# Patient Record
Sex: Male | Born: 1985 | Race: White | Hispanic: No | Marital: Single | Smoking: Current every day smoker
Health system: Southern US, Community
[De-identification: ages and names within clinical notes are randomized; demographics above are authoritative.]

## PROBLEM LIST (undated history)

## (undated) DIAGNOSIS — F419 Anxiety disorder, unspecified: Secondary | ICD-10-CM

## (undated) DIAGNOSIS — F32A Depression, unspecified: Secondary | ICD-10-CM

## (undated) DIAGNOSIS — F329 Major depressive disorder, single episode, unspecified: Secondary | ICD-10-CM

---
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
## 2004-12-19 ENCOUNTER — Ambulatory Visit (HOSPITAL_COMMUNITY): Payer: Self-pay | Admitting: Psychiatry

## 2005-01-16 ENCOUNTER — Ambulatory Visit (HOSPITAL_COMMUNITY): Payer: Self-pay | Admitting: Psychiatry

## 2005-03-01 ENCOUNTER — Ambulatory Visit (HOSPITAL_COMMUNITY): Payer: Self-pay | Admitting: Psychiatry

## 2005-04-24 ENCOUNTER — Ambulatory Visit (HOSPITAL_COMMUNITY): Payer: Self-pay | Admitting: Psychiatry

## 2005-07-03 ENCOUNTER — Ambulatory Visit (HOSPITAL_COMMUNITY): Payer: Self-pay | Admitting: Psychiatry

## 2005-07-11 ENCOUNTER — Emergency Department (HOSPITAL_COMMUNITY): Admission: EM | Admit: 2005-07-11 | Discharge: 2005-07-11 | Payer: Self-pay | Admitting: Emergency Medicine

## 2005-11-22 ENCOUNTER — Ambulatory Visit (HOSPITAL_COMMUNITY): Payer: Self-pay | Admitting: Psychiatry

## 2006-01-22 ENCOUNTER — Ambulatory Visit (HOSPITAL_COMMUNITY): Payer: Self-pay | Admitting: Psychiatry

## 2006-04-09 ENCOUNTER — Ambulatory Visit (HOSPITAL_COMMUNITY): Payer: Self-pay | Admitting: Psychiatry

## 2006-06-06 ENCOUNTER — Ambulatory Visit (HOSPITAL_COMMUNITY): Payer: Self-pay | Admitting: Psychiatry

## 2006-09-12 ENCOUNTER — Ambulatory Visit (HOSPITAL_COMMUNITY): Payer: Self-pay | Admitting: Psychiatry

## 2006-11-14 ENCOUNTER — Ambulatory Visit (HOSPITAL_COMMUNITY): Payer: Self-pay | Admitting: Psychiatry

## 2007-01-14 ENCOUNTER — Ambulatory Visit (HOSPITAL_COMMUNITY): Payer: Self-pay | Admitting: Psychiatry

## 2007-04-01 ENCOUNTER — Ambulatory Visit (HOSPITAL_COMMUNITY): Payer: Self-pay | Admitting: Psychiatry

## 2007-05-07 ENCOUNTER — Emergency Department (HOSPITAL_COMMUNITY): Admission: EM | Admit: 2007-05-07 | Discharge: 2007-05-07 | Payer: Self-pay | Admitting: Emergency Medicine

## 2007-11-13 ENCOUNTER — Ambulatory Visit (HOSPITAL_COMMUNITY): Payer: Self-pay | Admitting: Psychiatry

## 2008-03-30 ENCOUNTER — Ambulatory Visit (HOSPITAL_COMMUNITY): Payer: Self-pay | Admitting: Psychiatry

## 2008-06-29 ENCOUNTER — Emergency Department (HOSPITAL_COMMUNITY): Admission: EM | Admit: 2008-06-29 | Discharge: 2008-06-29 | Payer: Self-pay | Admitting: Emergency Medicine

## 2008-10-19 ENCOUNTER — Ambulatory Visit (HOSPITAL_COMMUNITY): Payer: Self-pay | Admitting: Psychiatry

## 2009-06-30 ENCOUNTER — Ambulatory Visit (HOSPITAL_COMMUNITY): Payer: Self-pay | Admitting: Psychiatry

## 2009-07-10 ENCOUNTER — Emergency Department (HOSPITAL_COMMUNITY): Admission: EM | Admit: 2009-07-10 | Discharge: 2009-07-10 | Payer: Self-pay | Admitting: Emergency Medicine

## 2009-12-01 ENCOUNTER — Emergency Department (HOSPITAL_COMMUNITY): Admission: EM | Admit: 2009-12-01 | Discharge: 2009-12-01 | Payer: Self-pay | Admitting: Family Medicine

## 2010-02-05 ENCOUNTER — Emergency Department (HOSPITAL_COMMUNITY)
Admission: EM | Admit: 2010-02-05 | Discharge: 2010-02-05 | Payer: Self-pay | Source: Home / Self Care | Admitting: Emergency Medicine

## 2010-03-27 LAB — GC/CHLAMYDIA PROBE AMP, GENITAL: Chlamydia, DNA Probe: NEGATIVE

## 2010-04-18 ENCOUNTER — Inpatient Hospital Stay (INDEPENDENT_AMBULATORY_CARE_PROVIDER_SITE_OTHER)
Admission: RE | Admit: 2010-04-18 | Discharge: 2010-04-18 | Disposition: A | Discharge status: Home / Self Care | Payer: PRIVATE HEALTH INSURANCE | Source: Ambulatory Visit | Attending: Emergency Medicine | Admitting: Emergency Medicine

## 2010-04-18 ENCOUNTER — Emergency Department (HOSPITAL_COMMUNITY)
Admission: EM | Admit: 2010-04-18 | Discharge: 2010-04-18 | Disposition: A | Discharge status: Home / Self Care | Payer: PRIVATE HEALTH INSURANCE | Attending: Emergency Medicine | Admitting: Emergency Medicine

## 2010-04-18 DIAGNOSIS — R197 Diarrhea, unspecified: Secondary | ICD-10-CM | POA: Insufficient documentation

## 2010-04-18 DIAGNOSIS — R109 Unspecified abdominal pain: Secondary | ICD-10-CM | POA: Insufficient documentation

## 2010-04-18 DIAGNOSIS — Z79899 Other long term (current) drug therapy: Secondary | ICD-10-CM | POA: Insufficient documentation

## 2010-04-18 DIAGNOSIS — R110 Nausea: Secondary | ICD-10-CM | POA: Insufficient documentation

## 2010-04-18 DIAGNOSIS — M546 Pain in thoracic spine: Secondary | ICD-10-CM | POA: Insufficient documentation

## 2010-04-18 DIAGNOSIS — R1011 Right upper quadrant pain: Secondary | ICD-10-CM

## 2010-04-18 DIAGNOSIS — F319 Bipolar disorder, unspecified: Secondary | ICD-10-CM | POA: Insufficient documentation

## 2010-04-18 DIAGNOSIS — R509 Fever, unspecified: Secondary | ICD-10-CM | POA: Insufficient documentation

## 2010-04-18 LAB — WOUND CULTURE

## 2010-04-22 ENCOUNTER — Emergency Department (HOSPITAL_COMMUNITY)
Admission: EM | Admit: 2010-04-22 | Discharge: 2010-04-22 | Discharge status: Against Medical Advice | Payer: PRIVATE HEALTH INSURANCE | Attending: Emergency Medicine | Admitting: Emergency Medicine

## 2010-04-22 DIAGNOSIS — R109 Unspecified abdominal pain: Secondary | ICD-10-CM | POA: Insufficient documentation

## 2010-09-19 ENCOUNTER — Emergency Department (HOSPITAL_COMMUNITY)
Admission: EM | Admit: 2010-09-19 | Discharge: 2010-09-19 | Disposition: A | Discharge status: Home / Self Care | Payer: Self-pay | Attending: Emergency Medicine | Admitting: Emergency Medicine

## 2010-09-19 DIAGNOSIS — W268XXA Contact with other sharp object(s), not elsewhere classified, initial encounter: Secondary | ICD-10-CM | POA: Insufficient documentation

## 2010-09-19 DIAGNOSIS — S51809A Unspecified open wound of unspecified forearm, initial encounter: Secondary | ICD-10-CM | POA: Insufficient documentation

## 2010-09-19 DIAGNOSIS — F319 Bipolar disorder, unspecified: Secondary | ICD-10-CM | POA: Insufficient documentation

## 2010-09-19 DIAGNOSIS — Y92009 Unspecified place in unspecified non-institutional (private) residence as the place of occurrence of the external cause: Secondary | ICD-10-CM | POA: Insufficient documentation

## 2010-09-23 ENCOUNTER — Emergency Department (HOSPITAL_COMMUNITY)
Admission: EM | Admit: 2010-09-23 | Discharge: 2010-09-24 | Disposition: A | Discharge status: Other Acute Inpatient Hospital | Payer: Self-pay | Attending: Emergency Medicine | Admitting: Emergency Medicine

## 2010-09-23 DIAGNOSIS — F3130 Bipolar disorder, current episode depressed, mild or moderate severity, unspecified: Secondary | ICD-10-CM | POA: Insufficient documentation

## 2010-09-23 DIAGNOSIS — F489 Nonpsychotic mental disorder, unspecified: Secondary | ICD-10-CM | POA: Insufficient documentation

## 2010-09-24 ENCOUNTER — Inpatient Hospital Stay (HOSPITAL_COMMUNITY)
Admission: EM | Admit: 2010-09-24 | Discharge: 2010-09-28 | DRG: 885 | Disposition: A | Discharge status: Home / Self Care | Payer: PRIVATE HEALTH INSURANCE | Source: Other Acute Inpatient Hospital | Attending: Psychiatry | Admitting: Psychiatry

## 2010-09-24 DIAGNOSIS — F411 Generalized anxiety disorder: Secondary | ICD-10-CM

## 2010-09-24 DIAGNOSIS — F410 Panic disorder [episodic paroxysmal anxiety] without agoraphobia: Secondary | ICD-10-CM

## 2010-09-24 DIAGNOSIS — F339 Major depressive disorder, recurrent, unspecified: Secondary | ICD-10-CM

## 2010-09-24 DIAGNOSIS — F1410 Cocaine abuse, uncomplicated: Secondary | ICD-10-CM

## 2010-09-24 DIAGNOSIS — X838XXA Intentional self-harm by other specified means, initial encounter: Secondary | ICD-10-CM

## 2010-09-24 DIAGNOSIS — F17200 Nicotine dependence, unspecified, uncomplicated: Secondary | ICD-10-CM

## 2010-09-24 DIAGNOSIS — S51809A Unspecified open wound of unspecified forearm, initial encounter: Secondary | ICD-10-CM

## 2010-09-24 DIAGNOSIS — Z560 Unemployment, unspecified: Secondary | ICD-10-CM

## 2010-09-24 DIAGNOSIS — F1110 Opioid abuse, uncomplicated: Secondary | ICD-10-CM

## 2010-09-24 DIAGNOSIS — F1010 Alcohol abuse, uncomplicated: Secondary | ICD-10-CM

## 2010-09-24 DIAGNOSIS — F1920 Other psychoactive substance dependence, uncomplicated: Secondary | ICD-10-CM

## 2010-09-24 DIAGNOSIS — F332 Major depressive disorder, recurrent severe without psychotic features: Principal | ICD-10-CM

## 2010-09-24 LAB — CBC
MCH: 31.8 pg (ref 26.0–34.0)
MCHC: 34.9 g/dL (ref 30.0–36.0)
RBC: 5.32 MIL/uL (ref 4.22–5.81)
WBC: 8.2 10*3/uL (ref 4.0–10.5)

## 2010-09-24 LAB — RAPID URINE DRUG SCREEN, HOSP PERFORMED
Benzodiazepines: POSITIVE — AB
Cocaine: NOT DETECTED
Tetrahydrocannabinol: POSITIVE — AB

## 2010-09-24 LAB — COMPREHENSIVE METABOLIC PANEL
AST: 38 U/L — ABNORMAL HIGH (ref 0–37)
Albumin: 4.3 g/dL (ref 3.5–5.2)
CO2: 30 mEq/L (ref 19–32)
Creatinine, Ser: 0.91 mg/dL (ref 0.50–1.35)
GFR calc non Af Amer: >60 mL/min (ref >60)
Total Bilirubin: 0.3 mg/dL (ref 0.3–1.2)

## 2010-09-24 LAB — DIFFERENTIAL
Basophils Relative: 0 % (ref 0–1)
Eosinophils Relative: 2 % (ref 0–5)
Monocytes Absolute: 0.8 10*3/uL (ref 0.1–1.0)
Monocytes Relative: 10 % (ref 3–12)

## 2010-09-24 LAB — ETHANOL: Alcohol, Ethyl (B): <11 mg/dL (ref 0–11)

## 2010-09-25 LAB — RPR: RPR Ser Ql: NONREACTIVE

## 2010-09-26 LAB — GC/CHLAMYDIA PROBE AMP, URINE: Chlamydia, Swab/Urine, PCR: NEGATIVE

## 2010-09-28 NOTE — Assessment & Plan Note (Signed)
  NAME:  Lee Hernandez, Lee Hernandez NO.:  [id number]  MEDICAL RECORD NO.:  [id number]  LOCATION:  0300                          FACILITY:  [facility]  PHYSICIAN:  Orson Aloe, MD       DATE OF BIRTH:  10/07/1985  DATE OF ADMISSION:  09/24/2010 DATE OF DISCHARGE:                      PSYCHIATRIC ADMISSION ASSESSMENT   HISTORY OF PRESENT ILLNESS:  The patient presented because his family was worried about him.  He states he is here for "his mental health." He has been using Roxicodone, drinking alcohol at times, and reported drinking "a lot," drinking when he wakes up from sleep.  He has lost about 32 pounds, but states that it was weight that he wanted to take off.  He denies any current suicidal thoughts.  Had a recent self- inflicted injury, cutting himself with a buck knife sustaining significant lacerations to both arms that required staples.  PAST PSYCHIATRIC HISTORY:  First admission to [facility]. Sees Dr. Tomasa Rand and was placed on Xanax for anxiety.  Has been on Depakote in the past, but off medications for 8 months.  SOCIAL HISTORY:  The patient is single.  He has a 2-year child who lives with the mother of that child.  He lives alone in [place].  He is currently unemployed.  Has a court date pending on Tuesday for assault.  FAMILY HISTORY:  None.  ALCOHOL AND DRUG HISTORY:  As above, drinking and using Roxicodone.  PRIMARY CARE PROVIDER:  None.  MEDICAL PROBLEMS:  History of self-inflicted injuries with staples to both forearms.  MEDICATIONS:  He has been prescribed Ambien and Xanax.  DRUG ALLERGIES:  No known allergies.  PHYSICAL EXAM:  The patient is a fully alert and cooperative.  His lacerations are intact.  No redness.  No swelling.  CBC is within normal limits.  Drug screen is positive for benzodiazepines.  MENTAL STATUS EXAM:  The patient is fully alert and cooperative, fair eye contact.  Seen with Dr. Allena Katz.  The patient is  dressed in scrubs. His speech is clear, normal pace and tone.  He seems sad, embarrassed about his injuries.  Thought processes are coherent, goal directed. Denies any psychotic symptoms and does not appear to be responding.  DIAGNOSES:  Axis I:  Major depressive disorder, polysubstance abuse. Axis II:  Deferred. Axis III:  Self-inflicted lacerations. Axis IV:  Problems with unemployment, possible other psychosocial problems in legal system. Axis V:  Current is 35-40.  PLAN:  Our plan is to place the patient on both the clonidine and Librium protocol, place the patient on Celexa for his depressive symptoms, monitor his wounds, identify a support group, and assess his motivation for rehab.     Landry Corporal, N.P.   ______________________________ Orson Aloe, MD    JO/MEDQ  D:  09/24/2010  T:  09/24/2010  Job:  [id number]  Electronically Signed by Limmie PatriciaP. on 09/25/2010 12:26:23 PM Electronically Signed by Orson Aloe  on 09/28/2010 01:12:55 PM
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
## 2011-10-19 ENCOUNTER — Emergency Department (HOSPITAL_COMMUNITY): Payer: PRIVATE HEALTH INSURANCE

## 2011-10-19 ENCOUNTER — Emergency Department (HOSPITAL_COMMUNITY)
Admission: EM | Admit: 2011-10-19 | Discharge: 2011-10-19 | Disposition: A | Discharge status: Home / Self Care | Payer: PRIVATE HEALTH INSURANCE | Attending: Emergency Medicine | Admitting: Emergency Medicine

## 2011-10-19 ENCOUNTER — Encounter (HOSPITAL_COMMUNITY): Payer: Self-pay | Admitting: *Deleted

## 2011-10-19 DIAGNOSIS — S61209A Unspecified open wound of unspecified finger without damage to nail, initial encounter: Secondary | ICD-10-CM | POA: Insufficient documentation

## 2011-10-19 DIAGNOSIS — S61219A Laceration without foreign body of unspecified finger without damage to nail, initial encounter: Secondary | ICD-10-CM

## 2011-10-19 DIAGNOSIS — W230XXA Caught, crushed, jammed, or pinched between moving objects, initial encounter: Secondary | ICD-10-CM | POA: Insufficient documentation

## 2011-10-19 DIAGNOSIS — S6710XA Crushing injury of unspecified finger(s), initial encounter: Secondary | ICD-10-CM

## 2011-10-19 MED ORDER — TRAMADOL HCL 50 MG PO TABS
50.0000 mg | ORAL_TABLET | Freq: Once | ORAL | Status: AC
  Administered 2011-10-19: 50 mg via ORAL
  Filled 2011-10-19: qty 1

## 2011-10-19 MED ORDER — IBUPROFEN 800 MG PO TABS
800.0000 mg | ORAL_TABLET | Freq: Once | ORAL | Status: AC
  Administered 2011-10-19: 800 mg via ORAL
  Filled 2011-10-19: qty 1

## 2011-10-19 MED ORDER — HYDROCODONE-ACETAMINOPHEN 5-325 MG PO TABS
1.0000 | ORAL_TABLET | Freq: Once | ORAL | Status: DC
  Filled 2011-10-19: qty 1

## 2011-10-19 MED ORDER — TRAMADOL HCL 50 MG PO TABS: 50.0000 mg | ORAL_TABLET | Freq: Once | ORAL | Status: DC

## 2011-10-19 NOTE — ED Notes (Addendum)
Pt slammed finger into the door.  Pt describes pain as a shooting pain up to his arm. Half of middle finger feels numb but pt able to move fingers.  Bleeding currently controlled.

## 2011-10-19 NOTE — ED Notes (Signed)
Pt presents after slamming left hand middle finger in car door; presents with laceration to finger pad; bruising to nail bed

## 2011-10-19 NOTE — ED Notes (Signed)
MD at bedside. 

## 2011-10-19 NOTE — ED Notes (Signed)
Konrad Dolores, MD at bedside.

## 2011-10-19 NOTE — ED Provider Notes (Signed)
History     CSN: [id number]  Arrival date & time 10/19/11  2218   First MD Initiated Contact with Patient 10/19/11 2244      Chief Complaint  Patient presents with  . Finger Injury    (Consider location/radiation/quality/duration/timing/severity/associated sxs/prior treatment) HPI CC: crush in jury to finger  Crush injury to finger: 1 hour ago pt closed L ring finger in door. Immediate pain and bleeding. No loss of sensation or consciousness. No alleviating or aggrevating factors. Denies CP, palpiations. ROM normal  History reviewed. No pertinent past medical history.  History reviewed. No pertinent past surgical history.  No family history on file.  History  Substance Use Topics  . Smoking status: Current Every Day Smoker -- 0.5 packs/day    Types: Cigarettes  . Smokeless tobacco: Not on file  . Alcohol Use: Yes     occ      Review of Systems Per hpi  Allergies  Tylenol  Home Medications   Current Outpatient Rx  Name Route Sig Dispense Refill  . TRAMADOL HCL 50 MG PO TABS Oral Take 1 tablet (50 mg total) by mouth once. 30 tablet 0    BP 171/115  Pulse 77  Temp 98.3 F (36.8 C) (Oral)  Resp 16  SpO2 100%  Physical Exam Gen: NAD Musc: normal ROM in fingers, <2 sec cap refill Skin: Small 1cm laceration extending into dermis of L 4th finger. Nail w/ pooling of blood. Neuro: Sensation intact in all digits,    ED Course  Procedures (including critical care time)  Labs Reviewed - No data to display Dg Finger Middle Left  10/19/2011  *RADIOLOGY REPORT*  Clinical Data: 26 year old male blunt trauma to the left middle finger.  Laceration and pain.  LEFT MIDDLE FINGER 2+V  Comparison: None.  Findings: Dressing material about the left third finger.  Distal phalanx appears intact.  Joint spaces preserved.  No acute fracture identified.  IMPRESSION: No acute fracture or dislocation identified about the left third finger.   Original Report Authenticated By:  Ulla Potash III, M.D.      1. Crush injury to finger   2. Laceration of finger       MDM  26yo male w/ crush injury and laceration to 4th L digit. No fracture. Nerves and vessels intact. Injury is self limiting and should resolve w/o invasive intervention. Pt given option fo rrepair vs concervative mgt. - Finger cleaned w/ chlorhexidine and bandaged w/ sterile dressing and bacitracin ointment - Careful wound care instructions given - Ibuprofen 800mg  - tramadol PRN          Ozella Rocks, MD 10/20/11 0001

## 2011-10-20 NOTE — ED Provider Notes (Signed)
I saw and evaluated the patient, reviewed the resident's note and I agree with the findings and plan.   .Face to face Exam:  General:  Awake HEENT:  Atraumatic Resp:  Normal effort Abd:  Nondistended Neuro:No focal weakness Lymph: No adenopathy   Nelia Shi, MD 10/20/11 [phone]

## 2012-10-21 ENCOUNTER — Emergency Department (HOSPITAL_COMMUNITY)
Admission: EM | Admit: 2012-10-21 | Discharge: 2012-10-21 | Disposition: A | Discharge status: Other Acute Inpatient Hospital | Payer: PRIVATE HEALTH INSURANCE | Attending: Emergency Medicine | Admitting: Emergency Medicine

## 2012-10-21 ENCOUNTER — Encounter (HOSPITAL_COMMUNITY): Payer: Self-pay | Admitting: Emergency Medicine

## 2012-10-21 ENCOUNTER — Inpatient Hospital Stay (HOSPITAL_COMMUNITY)
Admission: AD | Admit: 2012-10-21 | Discharge: 2012-10-25 | DRG: 885 | Disposition: A | Discharge status: Home / Self Care | Payer: Federal, State, Local not specified - Other | Source: Intra-hospital | Attending: Psychiatry | Admitting: Psychiatry

## 2012-10-21 DIAGNOSIS — T424X4A Poisoning by benzodiazepines, undetermined, initial encounter: Secondary | ICD-10-CM | POA: Insufficient documentation

## 2012-10-21 DIAGNOSIS — R450 Nervousness: Secondary | ICD-10-CM | POA: Insufficient documentation

## 2012-10-21 DIAGNOSIS — F1120 Opioid dependence, uncomplicated: Secondary | ICD-10-CM | POA: Diagnosis present

## 2012-10-21 DIAGNOSIS — T43502A Poisoning by unspecified antipsychotics and neuroleptics, intentional self-harm, initial encounter: Secondary | ICD-10-CM | POA: Insufficient documentation

## 2012-10-21 DIAGNOSIS — Z79899 Other long term (current) drug therapy: Secondary | ICD-10-CM

## 2012-10-21 DIAGNOSIS — F17200 Nicotine dependence, unspecified, uncomplicated: Secondary | ICD-10-CM | POA: Insufficient documentation

## 2012-10-21 DIAGNOSIS — F3130 Bipolar disorder, current episode depressed, mild or moderate severity, unspecified: Secondary | ICD-10-CM | POA: Insufficient documentation

## 2012-10-21 DIAGNOSIS — F1010 Alcohol abuse, uncomplicated: Secondary | ICD-10-CM | POA: Insufficient documentation

## 2012-10-21 DIAGNOSIS — R45851 Suicidal ideations: Secondary | ICD-10-CM

## 2012-10-21 DIAGNOSIS — F411 Generalized anxiety disorder: Secondary | ICD-10-CM | POA: Diagnosis present

## 2012-10-21 DIAGNOSIS — F314 Bipolar disorder, current episode depressed, severe, without psychotic features: Principal | ICD-10-CM | POA: Diagnosis present

## 2012-10-21 HISTORY — DX: Anxiety disorder, unspecified: F41.9

## 2012-10-21 HISTORY — DX: Depression, unspecified: F32.A

## 2012-10-21 HISTORY — DX: Major depressive disorder, single episode, unspecified: F32.9

## 2012-10-21 LAB — CBC
HCT: 46.8 % (ref 39.0–52.0)
Hemoglobin: 16.5 g/dL (ref 13.0–17.0)
MCV: 84 fL (ref 78.0–100.0)
RBC: 5.57 MIL/uL (ref 4.22–5.81)
RDW: 13.4 % (ref 11.5–15.5)
WBC: 8.7 10*3/uL (ref 4.0–10.5)

## 2012-10-21 LAB — COMPREHENSIVE METABOLIC PANEL
ALT: 69 U/L — ABNORMAL HIGH (ref 0–53)
Albumin: 4.7 g/dL (ref 3.5–5.2)
Alkaline Phosphatase: 67 U/L (ref 39–117)
BUN: 9 mg/dL (ref 6–23)
CO2: 25 mEq/L (ref 19–32)
Chloride: 99 mEq/L (ref 96–112)
Creatinine, Ser: 0.8 mg/dL (ref 0.50–1.35)
GFR calc Af Amer: >90 mL/min (ref >90)
Glucose, Bld: 92 mg/dL (ref 70–99)
Potassium: 4.2 mEq/L (ref 3.5–5.1)
Sodium: 136 mEq/L (ref 135–145)
Total Bilirubin: 0.7 mg/dL (ref 0.3–1.2)
Total Protein: 7.9 g/dL (ref 6.0–8.3)

## 2012-10-21 LAB — RAPID URINE DRUG SCREEN, HOSP PERFORMED
Amphetamines: NOT DETECTED
Barbiturates: NOT DETECTED
Cocaine: NOT DETECTED
Opiates: NOT DETECTED
Tetrahydrocannabinol: NOT DETECTED

## 2012-10-21 LAB — ETHANOL: Alcohol, Ethyl (B): <11 mg/dL (ref 0–11)

## 2012-10-21 LAB — SALICYLATE LEVEL: Salicylate Lvl: <2 mg/dL — ABNORMAL LOW (ref 2.8–20.0)

## 2012-10-21 MED ORDER — VITAMIN B-1 100 MG PO TABS
100.0000 mg | ORAL_TABLET | Freq: Every day | ORAL | Status: DC
  Administered 2012-10-22 – 2012-10-25 (×4): 100 mg via ORAL
  Filled 2012-10-21 (×5): qty 1

## 2012-10-21 MED ORDER — CHLORDIAZEPOXIDE HCL 25 MG PO CAPS: 25.0000 mg | ORAL_CAPSULE | Freq: Four times a day (QID) | ORAL | Status: DC

## 2012-10-21 MED ORDER — ONDANSETRON 4 MG PO TBDP: 4.0000 mg | ORAL_TABLET | Freq: Four times a day (QID) | ORAL | Status: DC | PRN

## 2012-10-21 MED ORDER — CHLORDIAZEPOXIDE HCL 25 MG PO CAPS: 50.0000 mg | ORAL_CAPSULE | Freq: Once | ORAL | Status: DC

## 2012-10-21 MED ORDER — ADULT MULTIVITAMIN W/MINERALS CH: 1.0000 | ORAL_TABLET | Freq: Every day | ORAL | Status: DC

## 2012-10-21 MED ORDER — HYDROXYZINE HCL 50 MG PO TABS
50.0000 mg | ORAL_TABLET | Freq: Every evening | ORAL | Status: DC | PRN
  Administered 2012-10-21 – 2012-10-22 (×2): 50 mg via ORAL
  Filled 2012-10-21 (×2): qty 1

## 2012-10-21 MED ORDER — CHLORDIAZEPOXIDE HCL 25 MG PO CAPS
25.0000 mg | ORAL_CAPSULE | Freq: Four times a day (QID) | ORAL | Status: AC | PRN
  Administered 2012-10-22: 25 mg via ORAL
  Filled 2012-10-21: qty 1

## 2012-10-21 MED ORDER — LOPERAMIDE HCL 2 MG PO CAPS: 2.0000 mg | ORAL_CAPSULE | ORAL | Status: DC | PRN

## 2012-10-21 MED ORDER — HYDROXYZINE HCL 25 MG PO TABS: 25.0000 mg | ORAL_TABLET | Freq: Four times a day (QID) | ORAL | Status: DC | PRN

## 2012-10-21 MED ORDER — THIAMINE HCL 100 MG/ML IJ SOLN: 100.0000 mg | Freq: Once | INTRAMUSCULAR | Status: DC

## 2012-10-21 MED ORDER — NICOTINE 21 MG/24HR TD PT24
21.0000 mg | MEDICATED_PATCH | Freq: Every day | TRANSDERMAL | Status: DC
  Administered 2012-10-22: 21 mg via TRANSDERMAL
  Filled 2012-10-21 (×5): qty 1

## 2012-10-21 MED ORDER — ACETAMINOPHEN 325 MG PO TABS: 650.0000 mg | ORAL_TABLET | Freq: Four times a day (QID) | ORAL | Status: DC | PRN

## 2012-10-21 MED ORDER — ALUM & MAG HYDROXIDE-SIMETH 200-200-20 MG/5ML PO SUSP: 30.0000 mL | ORAL | Status: DC | PRN

## 2012-10-21 MED ORDER — HYDROXYZINE HCL 25 MG PO TABS
25.0000 mg | ORAL_TABLET | Freq: Four times a day (QID) | ORAL | Status: AC | PRN
  Administered 2012-10-22 – 2012-10-24 (×5): 25 mg via ORAL
  Filled 2012-10-21 (×5): qty 1

## 2012-10-21 MED ORDER — CHLORDIAZEPOXIDE HCL 25 MG PO CAPS: 25.0000 mg | ORAL_CAPSULE | Freq: Three times a day (TID) | ORAL | Status: DC

## 2012-10-21 MED ORDER — LORAZEPAM 1 MG PO TABS
1.0000 mg | ORAL_TABLET | Freq: Once | ORAL | Status: AC
  Administered 2012-10-21: 1 mg via ORAL
  Filled 2012-10-21: qty 1

## 2012-10-21 MED ORDER — ADULT MULTIVITAMIN W/MINERALS CH
1.0000 | ORAL_TABLET | Freq: Every day | ORAL | Status: DC
  Administered 2012-10-22 – 2012-10-25 (×4): 1 via ORAL
  Filled 2012-10-21 (×5): qty 1

## 2012-10-21 MED ORDER — CHLORDIAZEPOXIDE HCL 25 MG PO CAPS: 25.0000 mg | ORAL_CAPSULE | Freq: Every day | ORAL | Status: DC

## 2012-10-21 MED ORDER — VITAMIN B-1 100 MG PO TABS: 100.0000 mg | ORAL_TABLET | Freq: Every day | ORAL | Status: DC

## 2012-10-21 MED ORDER — CHLORDIAZEPOXIDE HCL 25 MG PO CAPS: 25.0000 mg | ORAL_CAPSULE | ORAL | Status: DC

## 2012-10-21 MED ORDER — LOPERAMIDE HCL 2 MG PO CAPS: 2.0000 mg | ORAL_CAPSULE | ORAL | Status: AC | PRN

## 2012-10-21 MED ORDER — NICOTINE 21 MG/24HR TD PT24
21.0000 mg | MEDICATED_PATCH | Freq: Once | TRANSDERMAL | Status: DC
  Administered 2012-10-21: 21 mg via TRANSDERMAL
  Filled 2012-10-21: qty 1

## 2012-10-21 MED ORDER — MAGNESIUM HYDROXIDE 400 MG/5ML PO SUSP: 30.0000 mL | Freq: Every day | ORAL | Status: DC | PRN

## 2012-10-21 MED ORDER — CHLORDIAZEPOXIDE HCL 25 MG PO CAPS: 25.0000 mg | ORAL_CAPSULE | Freq: Four times a day (QID) | ORAL | Status: DC | PRN

## 2012-10-21 MED ORDER — CHLORDIAZEPOXIDE HCL 25 MG PO CAPS
50.0000 mg | ORAL_CAPSULE | Freq: Once | ORAL | Status: AC
  Administered 2012-10-21: 50 mg via ORAL
  Filled 2012-10-21: qty 2

## 2012-10-21 MED ORDER — ONDANSETRON 4 MG PO TBDP: 4.0000 mg | ORAL_TABLET | Freq: Four times a day (QID) | ORAL | Status: AC | PRN

## 2012-10-21 NOTE — ED Notes (Signed)
Dr. Shela Commons, EDP at bedside to speak with patient
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
## 2012-10-21 NOTE — ED Notes (Signed)
Belongings placed in patient bags and given to mother of patient.   Patients clothes, wallet, shoes, vapor juice, etc with mother.   Patient showed $240.00 in wallet to mother who took the wallet with the other belongings.  Patient placed in paper scrubs.  AC called and they advised no sitter available until after 3pm.

## 2012-10-21 NOTE — ED Notes (Signed)
Pt ate 100% of dinner

## 2012-10-21 NOTE — ED Notes (Addendum)
Telepsych complete. Pt reports feeling like "electrical impulses" are jumping out of his skin. Pt calm and cooperative at this time. EDP notified of pt complaint. Front desk is sending pt's family members back to visit after being wanded by security

## 2012-10-21 NOTE — ED Notes (Addendum)
[employer] at bedside to serve IVC papers and transport pt to [facility]

## 2012-10-21 NOTE — ED Notes (Addendum)
Pt notified of family members requesting to speak with patient. Pt states he "wants to leave now" and isn't suicidal now, states he misses his wife and "needs to leave". Pt out to nurse's station to use telephone. Yvonna Alanis, PA made aware of pt's request to leave

## 2012-10-21 NOTE — ED Notes (Signed)
Pt states he took 17 xanax last night and drank alcohol in an attempt to commit suicide, pt states he also took 3 more xanax this morning. Pt was on depakote but states he has not taken it in a year. Pt alert and oriented. Denies any SI at this time but per mother pt states "this is his time to go" prior to being taken back to room.

## 2012-10-21 NOTE — BH Assessment (Signed)
Consulted with PA [name] PA-C who is requesting a TTS consult as patient is presenting with SI.   Glorious Peach, MS, LCASA Assessment Counselor

## 2012-10-21 NOTE — Tx Team (Signed)
Initial Interdisciplinary Treatment Plan  PATIENT STRENGTHS: (choose at least two) Capable of independent living Communication skills General fund of knowledge Supportive family/friends Work skills  PATIENT STRESSORS: Loss of job 1.5 months ago Medication change or noncompliance Substance abuse   PROBLEM LIST: Problem List/Patient Goals Date to be addressed Date deferred Reason deferred Estimated date of resolution  Suicide Attempt 10/21/2012     Substance Abuse 10/21/2012     Depression 10/21/2012                                          DISCHARGE CRITERIA:  Ability to meet basic life and health needs Adequate post-discharge living arrangements Improved stabilization in mood, thinking, and/or behavior Withdrawal symptoms are absent or subacute and managed without 24-hour nursing intervention  PRELIMINARY DISCHARGE PLAN: Attend aftercare/continuing care group Participate in family therapy Return to previous living arrangement  PATIENT/FAMIILY INVOLVEMENT: This treatment plan has been presented to and reviewed with the patient, Lee Hernandez.  The patient and family have been given the opportunity to ask questions and make suggestions.  Angeline Slim M 10/21/2012, 10:12 PM

## 2012-10-21 NOTE — ED Provider Notes (Signed)
Patient overdose on alcohol and Xanax due to depression. Reportedly took 17 Xanax tablets. Presently patient alert cooperative Glasgow Coma Score 15. Results for orders placed during the hospital encounter of 10/21/12  CBC      Result Value Range   WBC 8.7  4.0 - 10.5 K/uL   RBC 5.57  4.22 - 5.81 MIL/uL   Hemoglobin 16.5  13.0 - 17.0 g/dL   HCT 19.1  47.8 - 29.5 %   MCV 84.0  78.0 - 100.0 fL   MCH 29.6  26.0 - 34.0 pg   MCHC 35.3  30.0 - 36.0 g/dL   RDW 62.1  30.8 - 65.7 %   Platelets 254  150 - 400 K/uL  COMPREHENSIVE METABOLIC PANEL      Result Value Range   Sodium 136  135 - 145 mEq/L   Potassium 4.2  3.5 - 5.1 mEq/L   Chloride 99  96 - 112 mEq/L   CO2 25  19 - 32 mEq/L   Glucose, Bld 92  70 - 99 mg/dL   BUN 9  6 - 23 mg/dL   Creatinine, Ser 8.46  0.50 - 1.35 mg/dL   Calcium 9.5  8.4 - 96.2 mg/dL   Total Protein 7.9  6.0 - 8.3 g/dL   Albumin 4.7  3.5 - 5.2 g/dL   AST 37  0 - 37 U/L   ALT 69 (*) 0 - 53 U/L   Alkaline Phosphatase 67  39 - 117 U/L   Total Bilirubin 0.7  0.3 - 1.2 mg/dL   GFR calc non Af Amer >90  >90 mL/min   GFR calc Af Amer >90  >90 mL/min  ETHANOL      Result Value Range   Alcohol, Ethyl (B) <11  0 - 11 mg/dL  ACETAMINOPHEN LEVEL      Result Value Range   Acetaminophen (Tylenol), Serum <15.0  10 - 30 ug/mL  SALICYLATE LEVEL      Result Value Range   Salicylate Lvl <2.0 (*) 2.8 - 20.0 mg/dL  URINE RAPID DRUG SCREEN (HOSP PERFORMED)      Result Value Range   Opiates NONE DETECTED  NONE DETECTED   Cocaine NONE DETECTED  NONE DETECTED   Benzodiazepines POSITIVE (*) NONE DETECTED   Amphetamines NONE DETECTED  NONE DETECTED   Tetrahydrocannabinol NONE DETECTED  NONE DETECTED   Barbiturates NONE DETECTED  NONE DETECTED  GLUCOSE, CAPILLARY      Result Value Range   Glucose-Capillary 95  70 - 99 mg/dL   No results found. ivc paper filed by me as pt expressed desire to leave the ED Pt to bbe transferred to [facility] accepting MD Dr Graylon Good, MD 10/21/12 1925

## 2012-10-21 NOTE — BH Assessment (Signed)
Consulted with EDP Dr. Rennis Chris to inform him that pt has been accepted to [facility] by Verne Spurr PA and assigned to Dr.Jonnalagadda for inpatient treatment. Pt has been assigned to 503-1. Spoke with Dr. Rennis Chris and informed him that IVC recommended as patient is not willing not sign in voluntarily per Nurse Irving Burton Gentile's report. Dr. Rennis Chris reports that he is aware of this.   Glorious Peach, MS, LCASA Assessment Counselor

## 2012-10-21 NOTE — ED Notes (Signed)
Pt's family at front desk to visit patient. Patient's wife/mother insistent on visiting with patient. Family advised of visitation policy and pt's phone call privileges. Pt's family requested to speak to "someone in charge". Misty RN/Director out to speak with patient's family.

## 2012-10-21 NOTE — ED Notes (Signed)
Pt's wife and mother given back their belongings and palced in Consultation room to await results of Telepsych. Family is calm, cooperative and supportive.

## 2012-10-21 NOTE — BH Assessment (Signed)
Spoke with patient's nurse. Informed nurse that pt has been accepted to [facility] assigned to bed 503-1. Pt's nurse inquired about IVC process. Pt's nurse will ensure that EDP complete's First Examinations and Recommendation and Affidavit and Petition. Nurse agreed to fax legal paperwork to [facility] prior to transfer of patient to ensure accuracy. Informed Nurse that Wynetta Emery, MHT will be in at 7pm and could further assist with IVC process.  Glorious Peach, MS, LCASA Assessment Counselor

## 2012-10-21 NOTE — ED Notes (Signed)
Patient states that he lost his job about a month ago.  Patient states he has chronic depression and anxiety.  He claims starting last night he just had feelings of "hopelessness and I took 17 xanax 1 mg tablets.   I was drinking too".   Patient states he "thought that mixing with a lot of alcohol, I wouldn't wake up".   Patient mother at side.   She advised that patient had a "serious attempt 2 years ago where he cut his wrists".

## 2012-10-21 NOTE — ED Notes (Signed)
EMTALA reviewed by Charge RN, Jae Dire

## 2012-10-21 NOTE — ED Notes (Addendum)
Dr. Ethelda Chick states plan to [facility] Commit Patient at this time. Charge RN,and Minerva Areola at [facility] made aware. Pt refusing medication at this time.

## 2012-10-21 NOTE — ED Provider Notes (Signed)
CSN: [id number]     Arrival date & time 10/21/12  1245 History   First MD Initiated Contact with Patient 10/21/12 1313     Chief Complaint  Patient presents with  . Suicidal   (Consider location/radiation/quality/duration/timing/severity/associated sxs/prior Treatment) HPI Comments: Patient is a 27 year old male with a past medical history of depression and anxiety who presents after a suicide attempt. Patient reports losing his job 1 month ago which has only made his depression and anxiety worse. Patient reports having feelings of hopelessness last night and took 17mg  of Xanax and drank 3 beers. Patient reports taking 3mg  Xanax this morning. Patient reports having attempted suicide in the past by slitting his wrists and was treated inpatient. Patient denies any other drug use. Patient denies homicidal ideation.    Past Medical History  Diagnosis Date  . Anxiety   . Depression   . Bipolar 1 disorder    History reviewed. No pertinent past surgical history. No family history on file. History  Substance Use Topics  . Smoking status: Current Every Day Smoker -- 0.50 packs/day    Types: Cigarettes  . Smokeless tobacco: Not on file  . Alcohol Use: Yes     Comment: occ    Review of Systems  Psychiatric/Behavioral: Positive for suicidal ideas and dysphoric mood. The patient is nervous/anxious.   All other systems reviewed and are negative.    Allergies  Tylenol  Home Medications   Current Outpatient Rx  Name  Route  Sig  Dispense  Refill  . Multiple Vitamins-Minerals (MULTIVITAMIN PO)   Oral   Take 1 tablet by mouth daily.          BP 122/78  Pulse 67  Temp(Src) 97.9 F (36.6 C) (Oral)  Resp 14  Ht 5\' 7"  (1.702 m)  Wt 209 lb 3 oz (94.887 kg)  BMI 32.76 kg/m2  SpO2 100% Physical Exam  Nursing note and vitals reviewed. Constitutional: He is oriented to person, place, and time. He appears well-developed and well-nourished. No distress.  HENT:  Head: Normocephalic  and atraumatic.  Eyes: Conjunctivae and EOM are normal. Pupils are equal, round, and reactive to light.  Neck: Normal range of motion.  Cardiovascular: Normal rate and regular rhythm.  Exam reveals no gallop and no friction rub.   No murmur heard. Pulmonary/Chest: Effort normal and breath sounds normal. He has no wheezes. He has no rales. He exhibits no tenderness.  Abdominal: Soft. He exhibits no distension. There is no tenderness. There is no rebound.  Musculoskeletal: Normal range of motion.  Neurological: He is alert and oriented to person, place, and time. Coordination normal.  Speech is goal-oriented. Moves limbs without ataxia.   Skin: Skin is warm and dry.  Psychiatric:  Flat affect and depressed mood.    ED Course  Procedures (including critical care time) Labs Review Labs Reviewed  COMPREHENSIVE METABOLIC PANEL - Abnormal; Notable for the following:    ALT 69 (*)    All other components within normal limits  SALICYLATE LEVEL - Abnormal; Notable for the following:    Salicylate Lvl <2.0 (*)    All other components within normal limits  URINE RAPID DRUG SCREEN (HOSP PERFORMED) - Abnormal; Notable for the following:    Benzodiazepines POSITIVE (*)    All other components within normal limits  CBC  ETHANOL  ACETAMINOPHEN LEVEL  GLUCOSE, CAPILLARY   Imaging Review No results found.  EKG Interpretation   None       MDM  No diagnosis found.  3:11 PM Labs unremarkable for acute changes. Patient will be moved to Pod C for psychiatric evaluation. Vitals stable and patient afebrile.     [name], [place] 10/21/12 [phone]

## 2012-10-21 NOTE — ED Notes (Signed)
Telepsych in progress. 

## 2012-10-21 NOTE — ED Notes (Signed)
Pt's wife and mother at bedside. Belongins (2 purses, 1 necklace) placed in locker 3.

## 2012-10-21 NOTE — ED Notes (Signed)
EDPA at bedside. Pt's dinner order faxed

## 2012-10-21 NOTE — ED Notes (Addendum)
Pt arrived to treatment room with a pair of shoes and in paper scrubs, no other belongings with pt. Patient wanded/cleared by security.

## 2012-10-21 NOTE — Progress Notes (Signed)
27 year old male who presents involuntarily for SI attempt.  Patient state she took 60 xanax and drank heavily the night prior to being admitted to the hospital.  Patient states he current;y does not feel that way.  Patient calm and cooperative during admission process.  Patient states his stressor are he lost his job about 1 month ago and states things have been spiraling out of control since.  Patient states he had a prior suicide attempt where he severely cut both his arms.  Patient states his mother and wife are supportive.  Patient states he has a history of substance abuse but states he has been buying it off of the street and states he has not had it in about 2 weeks.  Patient states he was also drinking alcohol but states he normally does not drink.  Patient states he has been taking xanax to help calm him and to help with his anxiety.   Patient states he also has been buying suboxone and Roxicodone that he has been getting off the street.  Patient minimizes his substance abuse and states he only uses it to help him with his depression.  Patient currently denies SI/HI and denies AVH.  Consents obtained, fall safety plan reviewed and patient verbalized understanding.  Food and fluids offered and patient refused both.  Patient escorted to the unit by [facility] MHT.

## 2012-10-21 NOTE — ED Notes (Addendum)
Received call from Minerva Areola at [facility] stating pt has been accepted and inpatient is reccommended.  This RN spoke with pt, pt states he does not want to go to. Dr. Shela Commons made aware. Pt's wife and mother updated on pt's status

## 2012-10-21 NOTE — ED Notes (Signed)
Pt refusing medication at this time

## 2012-10-21 NOTE — Progress Notes (Signed)
B.Keyleen Cerrato, MHT met with patient face to face to have him complete consent to release information. Patient signed consent to release information and it was faxed to [facility] and original copies were sent with [employer] to [facility].

## 2012-10-21 NOTE — ED Notes (Signed)
Pt's mother took his belongings home. 

## 2012-10-22 ENCOUNTER — Encounter (HOSPITAL_COMMUNITY): Payer: Self-pay | Admitting: Behavioral Health

## 2012-10-22 DIAGNOSIS — F314 Bipolar disorder, current episode depressed, severe, without psychotic features: Secondary | ICD-10-CM | POA: Diagnosis present

## 2012-10-22 DIAGNOSIS — R45851 Suicidal ideations: Secondary | ICD-10-CM

## 2012-10-22 DIAGNOSIS — F3130 Bipolar disorder, current episode depressed, mild or moderate severity, unspecified: Secondary | ICD-10-CM

## 2012-10-22 DIAGNOSIS — F1994 Other psychoactive substance use, unspecified with psychoactive substance-induced mood disorder: Secondary | ICD-10-CM

## 2012-10-22 DIAGNOSIS — F1910 Other psychoactive substance abuse, uncomplicated: Secondary | ICD-10-CM

## 2012-10-22 DIAGNOSIS — F1120 Opioid dependence, uncomplicated: Secondary | ICD-10-CM | POA: Diagnosis present

## 2012-10-22 MED ORDER — DULOXETINE HCL 30 MG PO CPEP
30.0000 mg | ORAL_CAPSULE | Freq: Every day | ORAL | Status: DC
  Administered 2012-10-22 – 2012-10-25 (×4): 30 mg via ORAL
  Filled 2012-10-22 (×6): qty 1

## 2012-10-22 MED ORDER — QUETIAPINE FUMARATE 100 MG PO TABS
100.0000 mg | ORAL_TABLET | Freq: Every day | ORAL | Status: DC
  Administered 2012-10-22 – 2012-10-24 (×3): 100 mg via ORAL
  Filled 2012-10-22 (×5): qty 1

## 2012-10-22 NOTE — BHH Counselor (Signed)
Adult Comprehensive Assessment  Patient ID: Lee Hernandez, male   DOB: [birth year], 27 y.o.   MRN: [id number]  Information Source: Information source: Patient  Current Stressors:  Educational / Learning stressors: N/A Employment / Job issues: unemployed Family Relationships: N/A Surveyor, quantity / Lack of resources (include bankruptcy): finances are tight Housing / Lack of housing: N/A Physical health (include injuries & life threatening diseases): N/A Social relationships: N/A Substance abuse: N/A Bereavement / Loss: N/A  Living/Environment/Situation:  Living Arrangements: Spouse/significant other Living conditions (as described by patient or guardian): Pt lives in [place] with wife and child.  Pt reports this is an "exceptional" home environment.  How long has patient lived in current situation?: 6 months What is atmosphere in current home: Supportive;Loving;Comfortable  Family History:  Marital status: Married Number of Years Married: 1 What types of issues is patient dealing with in the relationship?: Pt states that they have a great marriage.   Additional relationship information: N/A Does patient have children?: Yes How many children?: 2 How is patient's relationship with their children?: Pt has two children, reports having a good relationship with both kids.    Childhood History:  By whom was/is the patient raised?: Both parents Additional childhood history information: Pt reports having a "phenominal" childhood and couldnt ask for better parents.   Description of patient's relationship with caregiver when they were a child: Pt reports having a great relationship with parents.  Patient's description of current relationship with people who raised him/her: Pt states that his parents are still very supportive.   Does patient have siblings?: Yes Number of Siblings: 1 Description of patient's current relationship with siblings: 1 brother, pt reports good relationship with him Did  patient suffer any verbal/emotional/physical/sexual abuse as a child?: No Did patient suffer from severe childhood neglect?: No Has patient ever been sexually abused/assaulted/raped as an adolescent or adult?: No Was the patient ever a victim of a crime or a disaster?: No Witnessed domestic violence?: No Has patient been effected by domestic violence as an adult?: No  Education:  Highest grade of school patient has completed: 2 Associate's degrees Currently a student?: No Learning disability?: Yes What learning problems does patient have?: ADHD  Employment/Work Situation:   Employment situation: Unemployed Patient's job has been impacted by current illness: No What is the longest time patient has a held a job?: 2 years 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  If attempted suicide, did drugs/alcohol play a role in this?: No Alcohol/Substance Abuse Treatment Hx: Denies past history If yes, describe treatment: N/A Has alcohol/substance abuse ever caused legal problems?: No  Social Support System:   Patient's Community Support System: Good Describe Community Support System: Pt reports family is very supportive Type of faith/religion: Christian How does patient's faith help to cope with current illness?: prayer, attend [facility] program  Leisure/Recreation:   Leisure and Hobbies: Music, cars, guns, spending time with family  Strengths/Needs:   What things does the  patient do well?: Pt reports that he is  good at anything he learns.  In what areas does patient struggle / problems for patient: Depression, SI  Discharge Plan:   Does patient have access to transportation?: Yes Will patient be returning to same living situation after discharge?: Yes Currently receiving community mental health services: No If no, would patient like referral for services when discharged?: Yes (What county?) [facility]) Does patient have financial barriers related to discharge medications?: No  Summary/Recommendations:     Patient is a 27 year old Caucasian Male with a diagnosis of 296.33 Major Depressive Disorder,Severe, Recurrent, Alprazolam Use Disorder, 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  Patient lives in [place] with his family.  Pt states that he has been depressed and overdosed on xanax.  Pt doesn't think he needs to be here and was already planning on going to [facility] for outpatient services.  Patient will benefit from crisis stabilization, medication evaluation, group therapy and psycho education in addition to case management for discharge planning.    Lee Hernandez, Lee Hernandez. 10/22/2012

## 2012-10-22 NOTE — Progress Notes (Signed)
The focus of this group is to educate the patient on the purpose and policies of crisis stabilization and provide a format to answer questions about their admission.  The group details unit policies and expectations of patients while admitted.  Patient did not attended 0900 nurse education orientation group this morning.  Patient was in conference with MD and Child psychotherapist.

## 2012-10-22 NOTE — BHH Suicide Risk Assessment (Signed)
BHH INPATIENT:  Family/Significant Other Suicide Prevention Education  Suicide Prevention Education:  Education Completed; Hershel Corkery - wife [phone]),  (name of family member/significant other) has been identified by the patient as the family member/significant other with whom the patient will be residing, and identified as the person(s) who will aid the patient in the event of a mental health crisis (suicidal ideations/suicide attempt).  With written consent from the patient, the family member/significant other has been provided the following suicide prevention education, prior to the and/or following the discharge of the patient.  The suicide prevention education provided includes the following:  Suicide risk factors  Suicide prevention and interventions  National Suicide Hotline telephone number  [facility] assessment telephone number  [facility] Emergency Assistance 911  [facility] and/or Residential Mobile Crisis Unit telephone number  Request made of family/significant other to:  Remove weapons (e.g., guns, rifles, knives), all items previously/currently identified as safety concern.    Remove drugs/medications (over-the-counter, prescriptions, illicit drugs), all items previously/currently identified as a safety concern.  The family member/significant other verbalizes understanding of the suicide prevention education information provided.  The family member/significant other agrees to remove the items of safety concern listed above. Wife states that pt has OCD and shows symptoms at home such as placing things and fixing it for 10 minutes, fixates on things and has a tics.  Wife states that pt has a hard time coping with things that are out of his control, such a light not turning green when he wants it to.    Carmina Miller 10/22/2012, 2:48 PM

## 2012-10-22 NOTE — Progress Notes (Signed)
Patient ID: Lee Hernandez, male   DOB: July 25, 1985, 27 y.o.   MRN: [id number] D- Patient reports he slept fair and his appetite is good.  His energy level is normal and his ability to  pay attention is improving.  He is rating his depression at 4/10 and hopelessness at 4/10.  He is not feeling anxious at this time.  His only detox symptom is "feels like a light tazer is being applied to my skin all over" and vistaril decreased this feeling.  Patient says that he frequently feels happy until something happens to effect his good mood" like an argument with my wife".  Says that he doesn't always feel depressed but his moods vacillate a lot with circumsta.  A- Supported patient and encouraged him to go to groups.  Patient did go to group each time he was asked to go.GAve him information about groups.

## 2012-10-22 NOTE — Progress Notes (Signed)
D Pt. Had visitors this pm and went to bed after they left.  Writer had to waken the pt. To assess him.     A Writer offered support and encouragement.  R  Pt. Reports his depression is down to a 2 and pt. Denies SI and HI.  NO withdrawal symptoms noted, no complaints of pain or discomfort noted

## 2012-10-22 NOTE — H&P (Signed)
Psychiatric Admission Assessment Adult  Patient Identification:  Lee Hernandez Date of Evaluation:  10/22/2012 Chief Complaint:  bipolar disorder History of Present Illness: Patient admitted involuntarily, emergently from [facility] for depression and anxiety and suicide attempt. He has stated that he has been depressed and anxious since he lost his job and made a suicidal attempt with about 17 pills of 1 mg Xanax and drank a couple of beers. He has stated that he is feeling extremely hopeless and depressed. He  denies that his intention was to kill himself stating that he just wanted the psychological pain to go away and to sleep better. He reports increased isolation and hopelessness since losing his job about a month ago. He texted his mom out of desperation last night to inform her of how he felt and could not take the emotional pain any longer. He has a history of abusing Xanax, Etoh, Roxycodone,Klonopin,and Suboxone. His last abuse of Suboxone two weeks ago. He started taking Suboxone because his friends recommended that it helps with cravings and urges to use other substances. He describes his feeling like "mild electrical shocks" are going through his body. He had an appointment with [facility] 2-3 weeks ago and was prescribed medication. He was suppose to be seen by a counselor but failed to do so. He did not get his prescriptions filled because he feared that they would not be effective but on the contrary patient states he needs to get on the right medications. UDS is positive for benzo's.  Elements:  Location:  [facility] adult. Quality:  depression . Severity:  suicidal attempt. Timing:  lost job. Duration:  one month. Context:  financial and family stresses. Associated Signs/Synptoms: Depression Symptoms:  depressed mood, anhedonia, insomnia, psychomotor retardation, fatigue, feelings of worthlessness/guilt, difficulty concentrating, hopelessness, impaired memory, suicidal  attempt, anxiety, decreased labido, decreased appetite, (Hypo) Manic Symptoms:  Distractibility, Flight of Ideas, Impulsivity, Irritable Mood, Labiality of Mood, Anxiety Symptoms:  Excessive Worry, Psychotic Symptoms:  denied PTSD Symptoms: NA  Psychiatric Specialty Exam: Physical Exam  ROS  Blood pressure 123/85, pulse 74, temperature 97.1 F (36.2 C), temperature source Oral, resp. rate 16, height 5\' 7"  (1.702 m), weight 94.348 kg (208 lb), SpO2 100.00%.Body mass index is 32.57 kg/(m^2).  General Appearance: Disheveled  Eye Solicitor::  Fair  Speech:  Pressured  Volume:  Decreased  Mood:  Anxious, Depressed, Hopeless, Irritable and Worthless  Affect:  Depressed, Flat and Labile  Thought Process:  Disorganized and Tangential  Orientation:  Full (Time, Place, and Person)  Thought Content:  Rumination  Suicidal Thoughts:  Yes.  with intent/plan  Homicidal Thoughts:  No  Memory:  Immediate;   Fair  Judgement:  Impaired  Insight:  Lacking  Psychomotor Activity:  Restlessness  Concentration:  Fair  Recall:  Fair  Akathisia:  NA  Handed:  Right  AIMS (if indicated):     Assets:  Communication Skills Desire for Improvement Financial Resources/Insurance Physical Health Resilience Social Support Transportation  Sleep:  Number of Hours: 5.5    Past Psychiatric History: Diagnosis:  Hospitalizations:  Outpatient Care:  Substance Abuse Care:  Self-Mutilation:  Suicidal Attempts:  Violent Behaviors:   Past Medical History:   Past Medical History  Diagnosis Date  . Anxiety   . Depression   . Bipolar 1 disorder    None. Allergies:   Allergies  Allergen Reactions  . Tylenol [Acetaminophen] Rash and Other (See Comments)    Upset stomach   PTA Medications: Prescriptions prior to admission  Medication Sig Dispense Refill  . Multiple Vitamins-Minerals (MULTIVITAMIN PO) Take 1 tablet by mouth daily.        Previous Psychotropic Medications:  Medication/Dose   none               Substance Abuse History in the last 12 months:  yes  Consequences of Substance Abuse: NA  Social History:  reports that he has been smoking Cigarettes.  He has been smoking about 0.50 packs per day. He does not have any smokeless tobacco history on file. He reports that he drinks alcohol. He reports that he uses illicit drugs (Opium and Benzodiazepines). Additional Social History:                      Current Place of Residence:   Place of Birth:   Family Members: Marital Status:  Married Children:  Sons:  Daughters: Relationships: Education:  [employer] Problems/Performance: Religious Beliefs/Practices: History of Abuse (Emotional/Phsycial/Sexual) Teacher, music History:  None. Legal History: Hobbies/Interests:  Family History:  History reviewed. No pertinent family history.  Results for orders placed during the hospital encounter of 10/21/12 (from the past 72 hour(s))  CBC     Status: None   Collection Time    10/21/12  1:12 PM      Result Value Range   WBC 8.7  4.0 - 10.5 K/uL   RBC 5.57  4.22 - 5.81 MIL/uL   Hemoglobin 16.5  13.0 - 17.0 g/dL   HCT 16.1  09.6 - 04.5 %   MCV 84.0  78.0 - 100.0 fL   MCH 29.6  26.0 - 34.0 pg   MCHC 35.3  30.0 - 36.0 g/dL   RDW 40.9  81.1 - 91.4 %   Platelets 254  150 - 400 K/uL  COMPREHENSIVE METABOLIC PANEL     Status: Abnormal   Collection Time    10/21/12  1:12 PM      Result Value Range   Sodium 136  135 - 145 mEq/L   Potassium 4.2  3.5 - 5.1 mEq/L   Chloride 99  96 - 112 mEq/L   CO2 25  19 - 32 mEq/L   Glucose, Bld 92  70 - 99 mg/dL   BUN 9  6 - 23 mg/dL   Creatinine, Ser 7.82  0.50 - 1.35 mg/dL   Calcium 9.5  8.4 - 95.6 mg/dL   Total Protein 7.9  6.0 - 8.3 g/dL   Albumin 4.7  3.5 - 5.2 g/dL   AST 37  0 - 37 U/L   ALT 69 (*) 0 - 53 U/L   Alkaline Phosphatase 67  39 - 117 U/L   Total Bilirubin 0.7  0.3 - 1.2 mg/dL   GFR calc non Af Amer >90  >90  mL/min   GFR calc Af Amer >90  >90 mL/min   Comment: (NOTE)     The eGFR has been calculated using the CKD EPI equation.     This calculation has not been validated in all clinical situations.     eGFR's persistently <90 mL/min signify possible Chronic Kidney     Disease.  ETHANOL     Status: None   Collection Time    10/21/12  1:12 PM      Result Value Range   Alcohol, Ethyl (B) <11  0 - 11 mg/dL   Comment:            LOWEST DETECTABLE LIMIT FOR  SERUM ALCOHOL IS 11 mg/dL     FOR MEDICAL PURPOSES ONLY  ACETAMINOPHEN LEVEL     Status: None   Collection Time    10/21/12  1:12 PM      Result Value Range   Acetaminophen (Tylenol), Serum <15.0  10 - 30 ug/mL   Comment:            THERAPEUTIC CONCENTRATIONS VARY     SIGNIFICANTLY. A RANGE OF 10-30     ug/mL MAY BE AN EFFECTIVE     CONCENTRATION FOR MANY PATIENTS.     HOWEVER, SOME ARE BEST TREATED     AT CONCENTRATIONS OUTSIDE THIS     RANGE.     ACETAMINOPHEN CONCENTRATIONS     >150 ug/mL AT 4 HOURS AFTER     INGESTION AND >50 ug/mL AT 12     HOURS AFTER INGESTION ARE     OFTEN ASSOCIATED WITH TOXIC     REACTIONS.  SALICYLATE LEVEL     Status: Abnormal   Collection Time    10/21/12  1:12 PM      Result Value Range   Salicylate Lvl <2.0 (*) 2.8 - 20.0 mg/dL  GLUCOSE, CAPILLARY     Status: None   Collection Time    10/21/12  1:36 PM      Result Value Range   Glucose-Capillary 95  70 - 99 mg/dL  URINE RAPID DRUG SCREEN (HOSP PERFORMED)     Status: Abnormal   Collection Time    10/21/12  2:48 PM      Result Value Range   Opiates NONE DETECTED  NONE DETECTED   Cocaine NONE DETECTED  NONE DETECTED   Benzodiazepines POSITIVE (*) NONE DETECTED   Amphetamines NONE DETECTED  NONE DETECTED   Tetrahydrocannabinol NONE DETECTED  NONE DETECTED   Barbiturates NONE DETECTED  NONE DETECTED   Comment:            DRUG SCREEN FOR MEDICAL PURPOSES     ONLY.  IF CONFIRMATION IS NEEDED     FOR ANY PURPOSE, NOTIFY LAB     WITHIN 5  DAYS.                LOWEST DETECTABLE LIMITS     FOR URINE DRUG SCREEN     Drug Class       Cutoff (ng/mL)     Amphetamine      1000     Barbiturate      200     Benzodiazepine   200     Tricyclics       300     Opiates          300     Cocaine          300     THC              50   Psychological Evaluations:  Assessment:   DSM5:  Schizophrenia Disorders:   Obsessive-Compulsive Disorders:   Trauma-Stressor Disorders:   Substance/Addictive Disorders:  Alcohol Intoxication with Use Disorder - Severe (F10.229) and Opioid Disorder - Mild (305.50) Depressive Disorders:  Major Depressive Disorder - Severe (296.23)  AXIS I:  Bipolar, Depressed, Substance Induced Mood Disorder and Polysubstance abuse AXIS II:  Deferred AXIS III:   Past Medical History  Diagnosis Date  . Anxiety   . Depression   . Bipolar 1 disorder    AXIS IV:  economic problems, occupational problems, other psychosocial or environmental problems and problems  related to social environment AXIS V:  41-50 serious symptoms  Treatment Plan/Recommendations:  Admit involuntarily, emergently for bipolar disorder, depression with suicidal attempt, polysubstance abuse and safety monitoring.  Treatment Plan Summary: Daily contact with patient to assess and evaluate symptoms and progress in treatment Medication management Current Medications:  Current Facility-Administered Medications  Medication Dose Route Frequency Provider Last Rate Last Dose  . acetaminophen (TYLENOL) tablet 650 mg  650 mg Oral Q6H PRN Verne Spurr, PA-C      . alum & mag hydroxide-simeth (MAALOX/MYLANTA) 200-200-20 MG/5ML suspension 30 mL  30 mL Oral Q4H PRN Verne Spurr, PA-C      . chlordiazePOXIDE (LIBRIUM) capsule 25 mg  25 mg Oral Q6H PRN Verne Spurr, PA-C      . DULoxetine (CYMBALTA) DR capsule 30 mg  30 mg Oral Daily Nehemiah Settle, MD      . hydrOXYzine (ATARAX/VISTARIL) tablet 25 mg  25 mg Oral Q6H PRN Verne Spurr, PA-C    25 mg at 10/22/12 7829  . hydrOXYzine (ATARAX/VISTARIL) tablet 50 mg  50 mg Oral QHS PRN Verne Spurr, PA-C   50 mg at 10/21/12 2311  . loperamide (IMODIUM) capsule 2-4 mg  2-4 mg Oral PRN Verne Spurr, PA-C      . magnesium hydroxide (MILK OF MAGNESIA) suspension 30 mL  30 mL Oral Daily PRN Verne Spurr, PA-C      . multivitamin with minerals tablet 1 tablet  1 tablet Oral Daily Verne Spurr, PA-C   1 tablet at 10/22/12 0820  . nicotine (NICODERM CQ - dosed in mg/24 hours) patch 21 mg  21 mg Transdermal Q0600 Nehemiah Settle, MD   21 mg at 10/22/12 5621  . ondansetron (ZOFRAN-ODT) disintegrating tablet 4 mg  4 mg Oral Q6H PRN Verne Spurr, PA-C      . QUEtiapine (SEROQUEL) tablet 100 mg  100 mg Oral QHS Nehemiah Settle, MD      . thiamine (B-1) injection 100 mg  100 mg Intramuscular Once [employer], PA-C      . thiamine (VITAMIN B-1) tablet 100 mg  100 mg Oral Daily Verne Spurr, PA-C   100 mg at 10/22/12 0820    Observation Level/Precautions:  15 minute checks  Laboratory:  Reviewed admission labs  Psychotherapy:  Group, milieu, substance abuse counseling  Medications:  cymbalta and seroquel and supportive therapy  Consultations:  none  Discharge Concerns:  safety  Estimated LOS: 5-7 days  Other:     I certify that inpatient services furnished can reasonably be expected to improve the patient's condition.   Lee Hernandez,Lee R. 10/14/201412:55 PM

## 2012-10-22 NOTE — Progress Notes (Signed)
Adult Psychoeducational Group Note  Date:  10/22/2012 Time: 11:00am  Group Topic/Focus:  Recovery Goals:   The focus of this group is to identify appropriate goals for recovery and establish a plan to achieve them.  Participation Level:  Active  Participation Quality:  Appropriate and Attentive  Affect:  Appropriate  Cognitive:  Alert and Appropriate  Insight: Appropriate  Engagement in Group:  Engaged  Modes of Intervention:  Discussion and Education  Additional Comments:   Pt attended and participated in group. Discussion today was on recovery and what is their definition of recovery and what was their breaking point. Pt stated recovery means to him to be sober of anything that can become a habit. Pt stated his breaking point was losing his job and losing his possessions, struggling financially as well as feeling helpless.   Shelly Bombard D 10/22/2012, 1:24 PM

## 2012-10-22 NOTE — Progress Notes (Signed)
Adult Psychoeducational Group Note  Date:  10/22/2012 Time:  9:27 PM  Group Topic/Focus:  Wrap-Up Group:   The focus of this group is to help patients review their daily goal of treatment and discuss progress on daily workbooks.  Participation Level:  Did Not Attend   Gerrit Heck 10/22/2012, 9:27 PM

## 2012-10-22 NOTE — [facility] Group Notes (Signed)
[facility] LCSW Group Therapy  10/22/2012  1:15 PM   Type of Therapy:  Group Therapy  Participation Level:  Active  Participation Quality:  Appropriate and Attentive  Affect:  Appropriate and Anxious  Cognitive:  Alert and Appropriate  Insight:  Developing/Improving and Engaged  Engagement in Therapy:  Developing/Improving and Engaged  Modes of Intervention:  Clarification, Confrontation, Discussion, Education, Exploration, Limit-setting, Orientation, Problem-solving, Rapport Building, Dance movement psychotherapist, Socialization and Support  Summary of Progress/Problems: The topic for group therapy was feelings about diagnosis.  Pt actively participated in group discussion on their past and current diagnosis and how they feel towards this.  Pt also identified how society and family members judge them, based on their diagnosis as well as stereotypes and stigmas.   Pt shared that knows he needs to stay on his medication to stay well, as he reports going through this cycle three times before.  Pt was able to process what led to this hospitalization and accepting his diagnosis, to stay on meds in order to stay well.  Pt actively participated and was engaged in group discussion.    Reyes Ivan, LCSWA 10/22/2012 2:46 PM

## 2012-10-22 NOTE — Progress Notes (Signed)
Recreation Therapy Notes   Date: 10.14.2014 Time: 2:45pm Location: [address] Dayroom  Group Topic: Software engineer Activities (AAA)  Behavioral Response: Engaged, Attentive  Affect: Euthymic  Clinical Observations/Feedback: Dog Team: Education officer, museum. Patient interacted appropriately with peer, dog team, LRT and MHT.   Marykay Lex Karcyn Menn, LRT/CTRS  Jearl Klinefelter 10/22/2012 4:23 PM

## 2012-10-22 NOTE — Progress Notes (Signed)
Pt. Resting quietly.Marland Kitchen  Resp even unlabored, no signs of distress or discomfort noted.

## 2012-10-22 NOTE — [facility] Suicide Risk Assessment (Signed)
Suicide Risk Assessment  Admission Assessment     Nursing information obtained from:    Demographic factors:    Current Mental Status:    Loss Factors:    Historical Factors:    Risk Reduction Factors:     CLINICAL FACTORS:   Severe Anxiety and/or Agitation Bipolar Disorder:   Mixed State Depression:   Anhedonia Comorbid alcohol abuse/dependence Hopelessness Impulsivity Insomnia Recent sense of peace/wellbeing Severe Alcohol/Substance Abuse/Dependencies Personality Disorders:   Comorbid alcohol abuse/dependence Unstable or Poor Therapeutic Relationship Previous Psychiatric Diagnoses and Treatments Medical Diagnoses and Treatments/Surgeries  COGNITIVE FEATURES THAT CONTRIBUTE TO RISK:  Closed-mindedness Loss of executive function Polarized thinking Thought constriction (tunnel vision)    SUICIDE RISK:   Moderate:  Frequent suicidal ideation with limited intensity, and duration, some specificity in terms of plans, no associated intent, good self-control, limited dysphoria/symptomatology, some risk factors present, and identifiable protective factors, including available and accessible social support.  PLAN OF CARE: Admit involuntarily for bipolar disorder with depression and suicidal ideation and behaviors and polysubstance abuse.   I certify that inpatient services furnished can reasonably be expected to improve the patient's condition.  Lee Hernandez,Lee R. 10/22/2012, 12:53 PM

## 2012-10-23 NOTE — [facility] Group Notes (Signed)
[facility] LCSW Group Therapy  Emotional Regulation 1:15 - 2: 30 PM        10/23/2012    Type of Therapy:  Group Therapy  Participation Level:  Appropriate  Participation Quality:  Appropriate  Affect:  Appropriate  Cognitive:  Attentive Appropriate  Insight:  Engaged  Engagement in Therapy:  Engaged  Modes of Intervention:  Discussion Exploration Problem-Solving Supportive  Summary of Progress/Problems:  Group topic was emotional regulations.  Patient participated in the discussion and was able to identify an emotion that needed to regulated.  Patient shared he deals with guilt from loss of job and not being able to provide for his family as he once did.  Patient stated he knows suicide is not the answer to his problems. Patient was able to identify approprite coping skills.  Wynn Banker 10/23/2012

## 2012-10-23 NOTE — ED Provider Notes (Signed)
Medical screening examination/treatment/procedure(s) were performed by non-physician practitioner and as supervising physician I was immediately available for consultation/collaboration.  Patient discussed with me and with suicide attempt with Xanax. Will need telepsychiatry.   Shelda Jakes, MD 10/23/12 1031

## 2012-10-23 NOTE — Progress Notes (Signed)
[facility] MD Progress Note  10/23/2012 2:23 PM Lee Hernandez  MRN:  [id number] Subjective:  Lee Hernandez states he is doing well today. He states he feels better today than he did yesterday and that he realizes he didn't mean to kill himself, but he does feel that he needed to get back on his medication. Diagnosis:   DSM5: Schizophrenia Disorders:  Obsessive-Compulsive Disorders:  Trauma-Stressor Disorders:  Substance/Addictive Disorders: Alcohol Intoxication with Use Disorder - Severe (F10.229) and Opioid Disorder - Mild (305.50)  Depressive Disorders: Major Depressive Disorder - Severe (296.23)  AXIS I: Bipolar, Depressed, Substance Induced Mood Disorder and Polysubstance abuse  AXIS II: Deferred  AXIS III:  Past Medical History   Diagnosis  Date   .  Anxiety    .  Depression    .  Bipolar 1 disorder     AXIS IV: economic problems, occupational problems, other psychosocial or environmental problems and problems related to social environment  AXIS V: 41-50 serious symptoms  ADL's:  Intact  Sleep: Fair  Appetite:  Good  Suicidal Ideation:  SI + no intent, no plans Homicidal Ideation:  denies AEB (as evidenced by):  Psychiatric Specialty Exam: ROS  Blood pressure 116/79, pulse 100, temperature 97.8 F (36.6 C), temperature source Oral, resp. rate 16, height 5\' 7"  (1.702 m), weight 94.348 kg (208 lb), SpO2 100.00%.Body mass index is 32.57 kg/(m^2).  General Appearance: Disheveled  Eye Solicitor::  Fair  Speech:  Pressured  Volume:  Increased  Mood:  Anxious and Depressed  Affect:  Labile  Thought Process:  Circumstantial  Orientation:  Full (Time, Place, and Person)  Thought Content:  WDL  Suicidal Thoughts:  Yes.  without intent/plan  Homicidal Thoughts:  No  Memory:  Immediate;   Fair  Judgement:  Poor  Insight:  Present  Psychomotor Activity:  Increased  Concentration:  Fair  Recall:  Fair  Akathisia:  No  Handed:  Right  AIMS (if indicated):     Assets:  Communication  Skills Housing Physical Health Social Support  Sleep:  Number of Hours: 6.75   Current Medications: Current Facility-Administered Medications  Medication Dose Route Frequency Provider Last Rate Last Dose  . acetaminophen (TYLENOL) tablet 650 mg  650 mg Oral Q6H PRN Lee Spurr, PA-C      . alum & mag hydroxide-simeth (MAALOX/MYLANTA) 200-200-20 MG/5ML suspension 30 mL  30 mL Oral Q4H PRN Lee Spurr, PA-C      . chlordiazePOXIDE (LIBRIUM) capsule 25 mg  25 mg Oral Q6H PRN Lee Spurr, PA-C   25 mg at 10/22/12 1705  . DULoxetine (CYMBALTA) DR capsule 30 mg  30 mg Oral Daily Lee Settle, MD   30 mg at 10/23/12 0757  . hydrOXYzine (ATARAX/VISTARIL) tablet 25 mg  25 mg Oral Q6H PRN Lee Spurr, PA-C   25 mg at 10/23/12 1158  . hydrOXYzine (ATARAX/VISTARIL) tablet 50 mg  50 mg Oral QHS PRN Lee Spurr, PA-C   50 mg at 10/22/12 2215  . loperamide (IMODIUM) capsule 2-4 mg  2-4 mg Oral PRN Lee Spurr, PA-C      . magnesium hydroxide (MILK OF MAGNESIA) suspension 30 mL  30 mL Oral Daily PRN Lee Spurr, PA-C      . multivitamin with minerals tablet 1 tablet  1 tablet Oral Daily Lee Spurr, PA-C   1 tablet at 10/23/12 0757  . nicotine (NICODERM CQ - dosed in mg/24 hours) patch 21 mg  21 mg Transdermal Q0600 Lee Settle, MD  21 mg at 10/22/12 0624  . ondansetron (ZOFRAN-ODT) disintegrating tablet 4 mg  4 mg Oral Q6H PRN Lee Spurr, PA-C      . QUEtiapine (SEROQUEL) tablet 100 mg  100 mg Oral QHS Lee Settle, MD   100 mg at 10/22/12 2215  . thiamine (B-1) injection 100 mg  100 mg Intramuscular Once [employer], PA-C      . thiamine (VITAMIN B-1) tablet 100 mg  100 mg Oral Daily Lee Spurr, PA-C   100 mg at 10/23/12 1610    Lab Results:  Results for orders placed during the hospital encounter of 10/21/12 (from the past 48 hour(s))  URINE RAPID DRUG SCREEN (HOSP PERFORMED)     Status: Abnormal   Collection Time    10/21/12  2:48 PM       Result Value Range   Opiates NONE DETECTED  NONE DETECTED   Cocaine NONE DETECTED  NONE DETECTED   Benzodiazepines POSITIVE (*) NONE DETECTED   Amphetamines NONE DETECTED  NONE DETECTED   Tetrahydrocannabinol NONE DETECTED  NONE DETECTED   Barbiturates NONE DETECTED  NONE DETECTED   Comment:            DRUG SCREEN FOR MEDICAL PURPOSES     ONLY.  IF CONFIRMATION IS NEEDED     FOR ANY PURPOSE, NOTIFY LAB     WITHIN 5 DAYS.                LOWEST DETECTABLE LIMITS     FOR URINE DRUG SCREEN     Drug Class       Cutoff (ng/mL)     Amphetamine      1000     Barbiturate      200     Benzodiazepine   200     Tricyclics       300     Opiates          300     Cocaine          300     THC              50    Physical Findings: AIMS: Facial and Oral Movements Muscles of Facial Expression: None, normal Lips and Perioral Area: None, normal Jaw: None, normal Tongue: None, normal,Extremity Movements Upper (arms, wrists, hands, fingers): None, normal Lower (legs, knees, ankles, toes): None, normal, Trunk Movements Neck, shoulders, hips: None, normal, Overall Severity Severity of abnormal movements (highest score from questions above): None, normal Incapacitation due to abnormal movements: None, normal Patient's awareness of abnormal movements (rate only patient's report): No Awareness, Dental Status Current problems with teeth and/or dentures?: No Does patient usually wear dentures?: No  CIWA:  CIWA-Ar Total: 1 COWS:     Treatment Plan Summary: Daily contact with patient to assess and evaluate symptoms and progress in treatment Medication management  Plan: 1. Continue crisis management and stabilization. 2. Medication management to reduce current symptoms to base line and improve patient's overall level of functioning 3. Treat health problems as indicated. 4. Develop treatment plan to decrease risk of relapse upon discharge and the need for readmission. 5. Psycho-social  education regarding relapse prevention and self care. 6. Health care follow up as needed for medical problems. 7. Continue home medications where appropriate. 8. Will consider adding Depakote to patient's regimen. He will sleep on this and we will proceed if he agrees.   Medical Decision Making Problem Points:  Established problem, stable/improving (1) and New  problem, with no additional work-up planned (3) Data Points:  Review or order medicine tests (1)  I certify that inpatient services furnished can reasonably be expected to improve the patient's condition.   MASHBURN,NEIL 10/23/2012, 2:23 PM  Reviewed the information documented and agree with the treatment plan.  Donnel Venuto,JANARDHAHA R. 10/25/2012 12:43 PM

## 2012-10-23 NOTE — Progress Notes (Signed)
D: Patient pleasant and cooperative with staff and peers. Patient's affect is appropriate to circumstance and mood is anxious. Patient complained of anxiety 4/10. He reported on the self inventory sheet that he slept well last night, appetite is good, energy level is normal and ability to pay attention is improving. Patient rated depression "2" and feelings of hopelessness "1". He's attending and participating in groups. Compliant with medications.  A: Support and encouragement provided to patient. Scheduled medications administered per MD orders. Maintain Q15 minute checks for safety.  R: Patient receptive. Denies SI/HI. Patient remains safe.

## 2012-10-23 NOTE — Progress Notes (Signed)
The focus of this group is to help patients review their daily goal of treatment and discuss progress on daily workbooks. Pt attended the evening group session and responded to all discussion prompts from the Writer. Pt shared that he had a good day on the unit, the highlight of which was learning more about his condition from conversations with his daytime RN. Pt reported having no additional needs from Nursing Staff this evening. Pt's affect was appropriate and the Writer observed him interacting positively with his peers in group.

## 2012-10-23 NOTE — B[facility] Group Notes (Signed)
[facility] LCSW Aftercare Discharge Planning Group Note   10/23/2012 9:34 AM    Participation Quality:  Appropraite  Mood/Affect:  Appropriate  Depression Rating:  3  Anxiety Rating:  2  Thoughts of Suicide:  No  Will you contract for safety?   NA  Current AVH:  No  Plan for Discharge/Comments:  Patient attending discharge planning group and actively participated in group.  Patient shared he has been seen by Dr. Lolly Mustache in the past and would like to see him again.  Patient currently has no insurance and will be referred to [facility] for outpatient service.  CSW provided all participants with daily workbook.   Transportation Means: Patient has transportation.   Supports:  Patient has a support system.   Stanislav Gervase, Joesph July

## 2012-10-23 NOTE — Progress Notes (Signed)
Adult Psychoeducational Group Note  Date:  10/23/2012 Time:  11:00am Group Topic/Focus:  Personal Choices and Values:   The focus of this group is to help patients assess and explore the importance of values in their lives, how their values affect their decisions, how they express their values and what opposes their expression.  Participation Level:  Active  Participation Quality:  Appropriate and Attentive  Affect:  Appropriate  Cognitive:  Alert and Appropriate  Insight: Appropriate  Engagement in Group:  Engaged  Modes of Intervention:  Discussion and Education  Additional Comments:   Pt attended and participated in group. Discussion today was on personal development. The question was asked What is your current crisis, what were your triggers, and what could you have done to not make the situation worse? Pt stated his crisis is being unemployed,finances, and guilt. His triggers are substance abuse and thinking he didn't need meds to make it better. Pt stated what he could have done differently to not make things worse is not do drugs.  Shelly Bombard D 10/23/2012, 1:49 PM

## 2012-10-23 NOTE — Tx Team (Signed)
Interdisciplinary Treatment Plan Update   Date Reviewed:  10/23/2012  Time Reviewed:  9:38 AM  Progress in Treatment:   Attending groups: Yes Participating in groups: Yes Taking medication as prescribed: Yes  Tolerating medication: Yes Family/Significant other contact made: Yes, contact made with family  Patient understands diagnosis: Yes  Discussing patient identified problems/goals with staff: Yes Medical problems stabilized or resolved: Yes Denies suicidal/homicidal ideation: Yes Patient has not harmed self or others: Yes  For review of initial/current patient goals, please see plan of care.  Estimated Length of Stay:  2-4 days  Reasons for Continued Hospitalization:  Anxiety Depression Medication stabilization   New Problems/Goals identified:    Discharge Plan or Barriers:   Home with outpatient follow up with [facility]  Additional Comments:  Patient admitted involuntarily, emergently from [facility] for depression and anxiety and suicide attempt. He has stated that he has been depressed and anxious since he lost his job and made a suicidal attempt with about 17 pills of 1 mg Xanax and drank a couple of beers. He has stated that he is feeling extremely hopeless and depressed. He denies that his intention was to kill himself stating that he just wanted the psychological pain to go away and to sleep better. He reports increased isolation and hopelessness since losing his job about a month ago. He texted his mom out of desperation last night to inform her of how he felt and could not take the emotional pain any longer. He has a history of abusing Xanax, Etoh, Roxycodone,Klonopin,and Suboxone.   Attendees:  Patient:  10/23/2012 9:38 AM   Signature: Mervyn Gay, MD 10/23/2012 9:38 AM  Signature:  Verne Spurr, PA 10/23/2012 9:38 AM  Signature: 10/23/2012 9:38 AM  Signature: 10/23/2012 9:38 AM  Signature:   10/23/2012 9:38 AM  Signature:  Juline Patch, LCSW 10/23/2012 9:38 AM   Signature:  Reyes Ivan, LCSW 10/23/2012 9:38 AM  Signature:  Sharin Grave Coordinator 10/23/2012 9:38 AM  Signature:   10/23/2012 9:38 AM  Signature: Leighton Parody, RN 10/23/2012  9:38 AM  Signature:   Nestor Ramp, RN 10/23/2012  9:38 AM  Signature:  10/23/2012  9:38 AM    Scribe for Treatment Team:   Juline Patch,  10/23/2012 9:38 AM

## 2012-10-24 MED ORDER — DIVALPROEX SODIUM 250 MG PO DR TAB
250.0000 mg | DELAYED_RELEASE_TABLET | Freq: Two times a day (BID) | ORAL | Status: DC
  Administered 2012-10-24 – 2012-10-25 (×2): 250 mg via ORAL
  Filled 2012-10-24 (×5): qty 1

## 2012-10-24 NOTE — Progress Notes (Signed)
D: Pt is reporting to be having a good day today. He is proud that he has been less dependent on Vistaril to control his anxiety. He reports having a minimal amount of anxiety currently. Per pt, he had a great visit with his wife and daughter. Pt attended group this evening. Pt observed interacting appropriately within the milieu. Pt is currently denying any SI.  A: Writer administered scheduled medications to pt. Continued support and availability as needed was extended to this pt. Staff continue to monitor pt with q57min checks.  R: No adverse drug reactions noted. Pt receptive to treatment. Pt remains safe at this time.

## 2012-10-24 NOTE — BHH Group Notes (Signed)
BHH LCSW Group Therapy  [facility] 1:15 - 2:30 PM  10/24/2012   Type of Therapy:  Group Therapy  Participation Level:  Active  Participation Quality:  Attentive  Affect:  Appropriate  Cognitive:  Appropriate  Insight:  Developing/Improving and Engaged  Engagement in Therapy:  Developing/Improving Engaged  Modes of Intervention:  Discussion, Education, Exploration, Problem-Solving, Rapport Building, Support   Summary of Progress/Problems:  Patient listened attentively to speaker from [facility]. He shared he has experienced panic attacks and agreed with the speaker that deep breathing is effective.  Patient shared he plans to take advantage of services offered by [facility].  Wynn Banker 10/24/2012

## 2012-10-24 NOTE — BHH Suicide Risk Assessment (Signed)
B[facility] INPATIENT:  Family/Significant Other Suicide Prevention Education  Suicide Prevention Education:  Education Completed; Lee Hernandez, Wife, [phone]; has been identified by the patient as the family member/significant other with whom the patient will be residing, and identified as the person(s) who will aid the patient in the event of a mental health crisis (suicidal ideations/suicide attempt).  With written consent from the patient, the family member/significant other has been provided the following suicide prevention education, prior to the and/or following the discharge of the patient.  The suicide prevention education provided includes the following:  Suicide risk factors  Suicide prevention and interventions  National Suicide Hotline telephone number  [facility] assessment telephone number  [facility] Emergency Assistance 911  [facility] and/or Residential Mobile Crisis Unit telephone number  Request made of family/significant other to:  Remove weapons (e.g., guns, rifles, knives), all items previously/currently identified as safety concern.  Wife advised patient does not have access to guns.  Remove drugs/medications (over-the-counter, prescriptions, illicit drugs), all items previously/currently identified as a safety concern.  The family member/significant other verbalizes understanding of the suicide prevention education information provided.  The family member/significant other agrees to remove the items of safety concern listed above.  Wynn Banker 10/24/2012, 11:53 AM

## 2012-10-24 NOTE — Progress Notes (Signed)
D: Patient's affect is appropriate to circumstance and mood is anxious. He complained of anxiety 3/10. Patient voiced that being closed in on a hospital unit makes him very anxious. He reported on the self inventory sheet that he's sleeping well, appetite and ability to pay attention are good, and energy level is normal. He's going to groups throughout the day and tolerating medications well.  A: Support and encouragement provided to patient. Administered scheduled medications per ordering MD. PRN Vistaril given for anxiety. Monitor Q15 minute checks for safety.  R: Patient receptive. Denies SI/HI/AVH. Patient remains safe on the unit.

## 2012-10-24 NOTE — Progress Notes (Signed)
Recreation Therapy Notes  Date: 10.15.2014 Time: 3:00pm Location: [address] Dayroom  Group Topic: Problem Solving  Goal Area(s) Addresses:  Patient will successfully target area of improvement.  Patient will identify what steps they need to take to accomplish improvements.   Behavioral Response: Engaged, Appropriate, Sharing  Intervention: Journaling  Activity: 40-20-10-5. Patient was asked to identify one thing they want to improve in their lives using 40 words. Patient was then asked to cut the numbers of words used to describe area of improvement to 20, then 10 and finally 5.   Education: Problem Solving, Coping Skills, Discharge Planning.   Education Outcome: Acknowledges understanding  Clinical Observations/Feedback: Patient engaged in group session, completing activity as requested. Patient shared he was able to identify his area of improvement to the words "medication,"  "sober" and "better husband and father." Patient spoke openly about how he would like to become a better father and husband and related this to remaining sober, as well as continuing to take his medications. Patient additionally identified ways he can stay focused on his self-improvement, as well as how to include his support system in this process. Patient additionally appeared to actively listen to peer statements and group discussion, as he maintained appropriate eye contact with speaker.    Marykay Lex Linkyn Gobin, LRT/CTRS   Felicity Penix L 10/24/2012 10:01 AM

## 2012-10-24 NOTE — Progress Notes (Signed)
  Date:  10/24/2012 Time: 10:30AM  Group Topic/Focus:  Crisis Planning:   The purpose of this group is to help patients create a crisis plan for use upon discharge or in the future, as needed.  Participation Level:  Active  Participation Quality:  Appropriate, Sharing and Supportive  Affect:  Appropriate  Cognitive:  Appropriate  Insight: Appropriate and Good  Engagement in Group:  Engaged and Supportive  Modes of Intervention:  Discussion, Education and Support  Additional Comments:  Pt attended group, pt was appropriate.   Hebe Merriwether M 10/24/2012, 3:06 PM  

## 2012-10-24 NOTE — Progress Notes (Signed)
Pt attended karaoke group.  

## 2012-10-24 NOTE — Progress Notes (Signed)
D: Pt is appropriate in affect and mood. Pt reports anxiety when being in an enclosed environment (such as the unit). Pt is denying any depression. Pt is also negative for any SI/HI/AVH. Pt attended group this evening. Pt observed interacting appropriately within the milieu.  A: Writer administered scheduled medications to pt. Continued support and availability as needed was extended to this pt. Staff continue to monitor pt with q50min checks.  R: No adverse drug reactions noted. Pt receptive to treatment. Pt remains safe at this time.

## 2012-10-24 NOTE — Progress Notes (Signed)
Patient ID: Lee Hernandez, male   DOB: 1985-08-30, 27 y.o.   MRN: [id number] [facility] MD Progress Note  10/24/2012 5:28 PM JAREL CUADRA  MRN:  [id number] Subjective:  Lillard states he is doing well today. Patient talked at length regarding his history of drug abuse, his plans for the future and the need medication. He feels that his child is his reason for living, he states he has a good support system and he and his wife are making plans to continue their meetings as [facility] for support for their drug abuse. Diagnosis:   DSM5: Schizophrenia Disorders:  Obsessive-Compulsive Disorders:  Trauma-Stressor Disorders:  Substance/Addictive Disorders: Alcohol Intoxication with Use Disorder - Severe (F10.229) and Opioid Disorder - Mild (305.50)  Depressive Disorders: Major Depressive Disorder - Severe (296.23)  AXIS I: Bipolar, Depressed, Substance Induced Mood Disorder and Polysubstance abuse  AXIS II: Deferred  AXIS III:  Past Medical History   Diagnosis  Date   .  Anxiety    .  Depression    .  Bipolar 1 disorder     AXIS IV: economic problems, occupational problems, other psychosocial or environmental problems and problems related to social environment  AXIS V: 41-50 serious symptoms ADL's:  Intact Sleep: Fair  Appetite:  Good  Suicidal Ideation:  SI + no intent, no plans Homicidal Ideation:  denies AEB (as evidenced by):  Psychiatric Specialty Exam: ROS  Blood pressure 125/77, pulse 73, temperature 98.1 F (36.7 C), temperature source Oral, resp. rate 22, height 5\' 7"  (1.702 m), weight 94.348 kg (208 lb), SpO2 100.00%.Body mass index is 32.57 kg/(m^2).  General Appearance: Disheveled  Eye Solicitor::  Fair  Speech:  Pressured  Volume:  Increased  Mood:  Anxious and Depressed  Affect:  Labile  Thought Process:  Circumstantial  Orientation:  Full (Time, Place, and Person)  Thought Content:  WDL  Suicidal Thoughts:  Yes.  without intent/plan  Homicidal Thoughts:  No   Memory:  Immediate;   Fair  Judgement:  Poor  Insight:  Present  Psychomotor Activity:  Increased  Concentration:  Fair  Recall:  Fair  Akathisia:  No  Handed:  Right  AIMS (if indicated):     Assets:  Communication Skills Housing Physical Health Social Support  Sleep:  Number of Hours: 6.75   Current Medications: Current Facility-Administered Medications  Medication Dose Route Frequency Provider Last Rate Last Dose  . acetaminophen (TYLENOL) tablet 650 mg  650 mg Oral Q6H PRN Verne Spurr, PA-C      . alum & mag hydroxide-simeth (MAALOX/MYLANTA) 200-200-20 MG/5ML suspension 30 mL  30 mL Oral Q4H PRN Verne Spurr, PA-C      . chlordiazePOXIDE (LIBRIUM) capsule 25 mg  25 mg Oral Q6H PRN Verne Spurr, PA-C   25 mg at 10/22/12 1705  . divalproex (DEPAKOTE) DR tablet 250 mg  250 mg Oral Q12H Verne Spurr, PA-C   250 mg at 10/24/12 1147  . DULoxetine (CYMBALTA) DR capsule 30 mg  30 mg Oral Daily Nehemiah Settle, MD   30 mg at 10/24/12 0756  . hydrOXYzine (ATARAX/VISTARIL) tablet 25 mg  25 mg Oral Q6H PRN Verne Spurr, PA-C   25 mg at 10/24/12 0800  . hydrOXYzine (ATARAX/VISTARIL) tablet 50 mg  50 mg Oral QHS PRN Verne Spurr, PA-C   50 mg at 10/22/12 2215  . loperamide (IMODIUM) capsule 2-4 mg  2-4 mg Oral PRN Verne Spurr, PA-C      . magnesium hydroxide (MILK OF MAGNESIA) suspension  30 mL  30 mL Oral Daily PRN Verne Spurr, PA-C      . multivitamin with minerals tablet 1 tablet  1 tablet Oral Daily Verne Spurr, PA-C   1 tablet at 10/24/12 0756  . nicotine (NICODERM CQ - dosed in mg/24 hours) patch 21 mg  21 mg Transdermal Q0600 Nehemiah Settle, MD   21 mg at 10/22/12 0865  . ondansetron (ZOFRAN-ODT) disintegrating tablet 4 mg  4 mg Oral Q6H PRN Verne Spurr, PA-C      . QUEtiapine (SEROQUEL) tablet 100 mg  100 mg Oral QHS Nehemiah Settle, MD   100 mg at 10/23/12 2107  . thiamine (B-1) injection 100 mg  100 mg Intramuscular Once [employer],  PA-C      . thiamine (VITAMIN B-1) tablet 100 mg  100 mg Oral Daily Verne Spurr, PA-C   100 mg at 10/24/12 7846    Lab Results:  No results found for this or any previous visit (from the past 48 hour(s)).  Physical Findings: AIMS: Facial and Oral Movements Muscles of Facial Expression: None, normal Lips and Perioral Area: None, normal Jaw: None, normal Tongue: None, normal,Extremity Movements Upper (arms, wrists, hands, fingers): None, normal Lower (legs, knees, ankles, toes): None, normal, Trunk Movements Neck, shoulders, hips: None, normal, Overall Severity Severity of abnormal movements (highest score from questions above): None, normal Incapacitation due to abnormal movements: None, normal Patient's awareness of abnormal movements (rate only patient's report): No Awareness, Dental Status Current problems with teeth and/or dentures?: No Does patient usually wear dentures?: No  CIWA:  CIWA-Ar Total: 1 COWS:     Treatment Plan Summary: Daily contact with patient to assess and evaluate symptoms and progress in treatment Medication management  Plan: 1. Continue crisis management and stabilization. 2. Medication management to reduce current symptoms to base line and improve patient's overall level of functioning 3. Treat health problems as indicated. 4. Develop treatment plan to decrease risk of relapse upon discharge and the need for readmission. 5. Psycho-social education regarding relapse prevention and self care. 6. Health care follow up as needed for medical problems. 7. Continue home medications where appropriate. 8. Will initiate Depakote 250mg  po BID. 9. Will need labs Saturday or Sunday. If patient d/c's then can get it with follow up provider. Medical Decision Making Problem Points:  Established problem, stable/improving (1) and New problem, with no additional work-up planned (3) Data Points:  Review or order medicine tests (1)  I certify that inpatient services  furnished can reasonably be expected to improve the patient's condition.   MASHBURN,NEIL 10/24/2012, 5:28 PM  Reviewed the information documented and agree with the treatment plan.  Katy Brickell,JANARDHAHA R. 10/25/2012 12:44 PM

## 2012-10-25 MED ORDER — QUETIAPINE FUMARATE 100 MG PO TABS: 100.0000 mg | ORAL_TABLET | Freq: Every day | ORAL | Status: DC

## 2012-10-25 MED ORDER — DULOXETINE HCL 30 MG PO CPEP: 30.0000 mg | ORAL_CAPSULE | Freq: Every day | ORAL | Status: DC

## 2012-10-25 MED ORDER — DIVALPROEX SODIUM 250 MG PO DR TAB: 250.0000 mg | DELAYED_RELEASE_TABLET | Freq: Two times a day (BID) | ORAL | Status: DC

## 2012-10-25 NOTE — Progress Notes (Signed)
[facility] Adult Case Management Discharge Plan :  Will you be returning to the same living situation after discharge: Yes,  Patient will return home with wife. At discharge, do you have transportation home?:Yes,  Patient to arrange transporation home. Do you have the ability to pay for your medications: No, patient will assisted with indigent medications.  Release of information consent forms completed and in the chart;  Patient's signature needed at discharge.  Patient to Follow up at: Follow-up Information   Follow up with [facility] On 10/28/2012. (Please go to [facility]'s walk in clinic on Monday, October 28, 2012 or any weekday between 8AM-3PM for medication management)    Contact information:   [address], [place] [address] Phone: [phone] Fax: [phone]      [facility] up with [facility]. (Tuesday, 21, 2014 at 11:00 PM)    Contact information:   [address] [place], [place]   [address]  [phone]      Patient denies SI/HI:  Patient no longer endorsing SI/HI or other thoughts of self harm.   Safety Planning and Suicide Prevention discussed: .Reviewed with all patients during discharge planning group   Lee Hernandez 10/25/2012, 10:12 AM

## 2012-10-25 NOTE — BHH Suicide Risk Assessment (Signed)
Suicide Risk Assessment  Discharge Assessment     Demographic Factors:  Male, Adolescent or young adult, Caucasian, Low socioeconomic status and Unemployed  Mental Status Per Nursing Assessment::   On Admission:     Current Mental Status by Physician: Mental Status Examination: Patient appeared as per his stated age, casually dressed, and fairly groomed, and maintaining good eye contact. Patient has good mood and his affect was constricted. He has normal rate, rhythm, and volume of speech. His thought process is linear and goal directed. Patient has denied suicidal, homicidal ideations, intentions or plans. Patient has no evidence of auditory or visual hallucinations, delusions, and paranoia. Patient has fair insight judgment and impulse control.  Loss Factors: Financial problems/change in socioeconomic status  Historical Factors: Impulsivity  Risk Reduction Factors:   Sense of responsibility to family, Religious beliefs about death, Living with another person, especially a relative, Positive social support and Positive coping skills or problem solving skills  Continued Clinical Symptoms:  Alcohol/Substance Abuse/Dependencies  Cognitive Features That Contribute To Risk:  Polarized thinking    Suicide Risk:  Minimal: No identifiable suicidal ideation.  Patients presenting with no risk factors but with morbid ruminations; may be classified as minimal risk based on the severity of the depressive symptoms  Discharge Diagnoses:   AXIS I:  Bipolar, Depressed, Substance Induced Mood Disorder and Alcohol dependence and opioid dependence AXIS II:  Deferred AXIS III:   Past Medical History  Diagnosis Date  . Anxiety   . Depression   . Bipolar 1 disorder    AXIS IV:  economic problems, other psychosocial or environmental problems, problems related to social environment and problems with primary support group AXIS V:  51-60 moderate symptoms  Plan Of Care/Follow-up recommendations:   Activity:  As tolerated Diet:  Regular  Is patient on multiple antipsychotic therapies at discharge:  No   Has Patient had three or more failed trials of antipsychotic monotherapy by history:  No  Recommended Plan for Multiple Antipsychotic Therapies: NA  Vita Currin,JANARDHAHA R. 10/25/2012, 12:30 PM

## 2012-10-25 NOTE — Progress Notes (Signed)
Adult Psychoeducational Group Note  Date:  10/25/2012 Time:  10:48 AM  Group Topic/Focus:  Relapse Prevention Planning:   The focus of this group is to define relapse and discuss the need for planning to combat relapse.  Participation Level:  Active  Participation Quality:  Appropriate  Affect:  Appropriate  Cognitive:  Appropriate  Insight: Appropriate  Engagement in Group:  Engaged  Modes of Intervention:  Activity and Discussion  Additional Comments:   Pts watched a video "Recognizing Depression". Afterwards, pts dicussed how they related to the individuals on the video and how recognizing the signs of depressions could prevent episodes of breakdown. Each pt received a journal and where encouraged to write their thoughts, feelings, goals, and ideas down daily.    Tora Perches N 10/25/2012, 10:48 AM

## 2012-10-25 NOTE — Progress Notes (Signed)
Discharge Note: Discharge instructions/prescriptions/medication samples given to patient. Patient verbalized understanding of discharge instructions and prescriptions. Denies SI/HI/AVH. Patient d/c without incident to the front lobby and transported home by wife.

## 2012-10-25 NOTE — Discharge Summary (Signed)
Physician Discharge Summary Note  Patient:  Lee Hernandez is an 27 y.o., male MRN:  [id number] DOB:  May 14, 1985 Patient phone:  [phone] (home)  Patient address:   [address] [place] [address],   Date of Admission:  10/21/2012 Date of Discharge: 10/25/2012  Reason for Admission:  Overdose  Discharge Diagnoses: Principal Problem:   Bipolar I disorder with depression, severe Active Problems:   Polysubstance dependence including opioid type drug, episodic abuse   Suicidal ideations  ROS  DSM5: DSM5:  Schizophrenia Disorders:  Obsessive-Compulsive Disorders:  Trauma-Stressor Disorders:  Substance/Addictive Disorders: Alcohol Intoxication with Use Disorder - Severe (F10.229) and Opioid Disorder - Mild (305.50)  Depressive Disorders: Major Depressive Disorder - Severe (296.23)  AXIS I: Bipolar, Depressed, Substance Induced Mood Disorder and Polysubstance abuse  AXIS II: Deferred  AXIS III:  Past Medical History   Diagnosis  Date   .  Anxiety    .  Depression    .  Bipolar 1 disorder     AXIS IV: economic problems, occupational problems, other psychosocial or environmental problems and problems related to social environment  AXIS V: 41-50 serious symptoms  Level of Care:  OP  Hospital Course:     Lee Hernandez was admitted involuntarily, emergently from [facility] for depression and anxiety and suicide attempt. He has stated that he has been depressed and anxious since he lost his job and made a suicidal attempt with about 17 pills of 1 mg Xanax and drank a couple of beers. He has stated that he is feeling extremely hopeless and depressed. He denies that his intention was to kill himself stating that he just wanted the psychological pain to go away and to sleep better. He reports increased isolation and hopelessness since losing his job about a month ago.                Lee Hernandez was admitted to the adult unit. He was evaluated and his symptoms were identified.  Medication management was discussed and initiated. He was oriented to the unit and encouraged to participate in unit programming. Medical problems were identified and treated appropriately. Home medication was restarted as needed.        The patient was evaluated each day by a clinical provider to ascertain the patient's response to treatment.  Improvement was noted by the patient's report of decreasing symptoms, improved sleep and appetite, affect, medication tolerance, behavior, and participation in unit programming.          Lee Hernandez was asked each day to complete a self inventory noting mood, mental status, pain, new symptoms, anxiety and concerns.         He responded well to medication and being in a therapeutic and supportive environment. Positive and appropriate behavior was noted and the patient was motivated for recovery.  Lee Hernandez's family was supportive and assisted with his plans for remaining clean and sober upon discharge including going to [facility], attending his church and taking his medication. He was active in unit programming and was appropriate with staff during his admission. He did not require a 1:1 during his stay and was respectful of the other patients.         By the day of discharge Lee Hernandez was in much improved condition than upon admission.  Symptoms were reported as significantly decreased or resolved completely.  The patient denied SI/HI and voiced no AVH. He was motivated to continue taking medication with a goal of continued improvement in mental health.  Lee Hernandez was discharged home with a plan to follow up as noted below. Consults:  None  Significant Diagnostic Studies:  labs: CBC, CMP, UDS, UA  Discharge Vitals:   Blood pressure 136/87, pulse 79, temperature 98 F (36.7 C), temperature source Oral, resp. rate 16, height 5\' 7"  (1.702 m), weight 94.348 kg (208 lb), SpO2 100.00%. Body mass index is 32.57 kg/(m^2). Lab Results:   No results found for  this or any previous visit (from the past 72 hour(s)).  Physical Findings: AIMS: Facial and Oral Movements Muscles of Facial Expression: None, normal Lips and Perioral Area: None, normal Jaw: None, normal Tongue: None, normal,Extremity Movements Upper (arms, wrists, hands, fingers): None, normal Lower (legs, knees, ankles, toes): None, normal, Trunk Movements Neck, shoulders, hips: None, normal, Overall Severity Severity of abnormal movements (highest score from questions above): None, normal Incapacitation due to abnormal movements: None, normal Patient's awareness of abnormal movements (rate only patient's report): No Awareness, Dental Status Current problems with teeth and/or dentures?: No Does patient usually wear dentures?: No  CIWA:  CIWA-Ar Total: 1 COWS:     Psychiatric Specialty Exam: See Psychiatric Specialty Exam and Suicide Risk Assessment completed by Attending Physician prior to discharge.  Discharge destination:  Home  Is patient on multiple antipsychotic therapies at discharge:  No   Has Patient had three or more failed trials of antipsychotic monotherapy by history:  No  Recommended Plan for Multiple Antipsychotic Therapies: NA  Discharge Orders   Future Orders Complete By Expires   Diet - low sodium heart healthy  As directed    Discharge instructions  As directed    Comments:     Take all of your medications as directed. Be sure to keep all of your follow up appointments.  If you are unable to keep your follow up appointment, call your Doctor's office to let them know, and reschedule.  Make sure that you have enough medication to last until your appointment. Be sure to get plenty of rest. Going to bed at the same time each night will help. Try to avoid sleeping during the day.  Increase your activity as tolerated. Regular exercise will help you to sleep better and improve your mental health. Eating a heart healthy diet is recommended. Try to avoid salty or  fried foods. Be sure to avoid all alcohol and illegal drugs.   Increase activity slowly  As directed        Medication List    STOP taking these medications       MULTIVITAMIN PO      TAKE these medications     Indication   divalproex 250 MG DR tablet  Commonly known as:  DEPAKOTE  Take 1 tablet (250 mg total) by mouth every 12 (twelve) hours. For mood stabilization.   Indication:  Manic Phase of Manic-Depression, mood stabilization     DULoxetine 30 MG capsule  Commonly known as:  CYMBALTA  Take 1 capsule (30 mg total) by mouth daily. For depression.   Indication:  Major Depressive Disorder     QUEtiapine 100 MG tablet  Commonly known as:  SEROQUEL  Take 1 tablet (100 mg total) by mouth at bedtime. For mood stabilization, depression and insomnia.   Indication:  Manic Phase of Manic-Depression           Follow-up Information   Follow up with [facility] On 10/28/2012. (Please go to [facility]'s walk in clinic on Monday, October 28, 2012 or any weekday between 8AM-3PM for  medication management)    Contact information:   [address], [place] [address] Phone: [phone] Fax: [phone]      Follow up with [facility]. (Tuesday, 21, 2014 at 11:00 PM)    Contact information:   [address] [place], [place]   [address]  [phone]      Follow-up recommendations:   Activities: Resume activity as tolerated. Diet: Heart healthy low sodium diet Tests: Follow up testing will be determined by your out patient provider. Comments:   Total Discharge Time:  Less than 30 minutes.  Signed: Rona Ravens. Mashburn RPAC 11:45 AM 10/25/2012  Patient is seen for psychiatric evaluation, suicidal risk assessment and case discussed with physician extender and formulated discharge treatment plan. Reviewed the information documented and agree with the treatment plan.  Marshella Tello,JANARDHAHA R. 10/25/2012 6:22 PM

## 2012-10-25 NOTE — [facility] Group Notes (Signed)
[facility] LCSW Aftercare Discharge Planning Group Note   10/25/2012 10:19 AM    Participation Quality:  Appropraite  Mood/Affect:  Appropriate  Depression Rating:  1  Anxiety Rating:  1  Thoughts of Suicide:  No  Will you contract for safety?   NA  Current AVH:  No  Plan for Discharge/Comments:  Patient attending discharge planning group and actively participated in group.  He reports doing well and hopes to discharge home today.  He will follow up with [facility] and [facility].  CSW provided all participants with daily workbook and information on services offered by [facility].   Transportation Means: Patient has transportation.   Supports:  Patient has a support system.   Zalika Tieszen, Joesph July

## 2012-10-29 NOTE — Progress Notes (Signed)
Patient Discharge Instructions:  After Visit Summary (AVS):   Faxed to:  10/29/12 Discharge Summary Note:   Faxed to:  10/29/12 Psychiatric Admission Assessment Note:   Faxed to:  10/29/12 Suicide Risk Assessment - Discharge Assessment:   Faxed to:  10/29/12 Faxed/Sent to the Next Level Care provider:  10/29/12 Faxed to [facility] @ [phone] Faxed to [facility] @ [phone]  Jerelene Redden, 10/29/2012, 3:48 PM

## 2013-02-13 ENCOUNTER — Encounter (HOSPITAL_BASED_OUTPATIENT_CLINIC_OR_DEPARTMENT_OTHER): Payer: Self-pay | Admitting: Emergency Medicine

## 2013-02-13 ENCOUNTER — Emergency Department (HOSPITAL_BASED_OUTPATIENT_CLINIC_OR_DEPARTMENT_OTHER)
Admission: EM | Admit: 2013-02-13 | Discharge: 2013-02-13 | Disposition: A | Discharge status: Home / Self Care | Payer: Worker's Compensation | Attending: Emergency Medicine | Admitting: Emergency Medicine

## 2013-02-13 DIAGNOSIS — Y9389 Activity, other specified: Secondary | ICD-10-CM | POA: Insufficient documentation

## 2013-02-13 DIAGNOSIS — Z79899 Other long term (current) drug therapy: Secondary | ICD-10-CM | POA: Insufficient documentation

## 2013-02-13 DIAGNOSIS — F411 Generalized anxiety disorder: Secondary | ICD-10-CM | POA: Insufficient documentation

## 2013-02-13 DIAGNOSIS — F329 Major depressive disorder, single episode, unspecified: Secondary | ICD-10-CM | POA: Diagnosis not present

## 2013-02-13 DIAGNOSIS — F17200 Nicotine dependence, unspecified, uncomplicated: Secondary | ICD-10-CM | POA: Insufficient documentation

## 2013-02-13 DIAGNOSIS — F3289 Other specified depressive episodes: Secondary | ICD-10-CM | POA: Insufficient documentation

## 2013-02-13 DIAGNOSIS — Y9289 Other specified places as the place of occurrence of the external cause: Secondary | ICD-10-CM | POA: Diagnosis not present

## 2013-02-13 DIAGNOSIS — W268XXA Contact with other sharp object(s), not elsewhere classified, initial encounter: Secondary | ICD-10-CM | POA: Insufficient documentation

## 2013-02-13 DIAGNOSIS — S61218A Laceration without foreign body of other finger without damage to nail, initial encounter: Secondary | ICD-10-CM

## 2013-02-13 DIAGNOSIS — Y990 Civilian activity done for income or pay: Secondary | ICD-10-CM | POA: Insufficient documentation

## 2013-02-13 DIAGNOSIS — S61209A Unspecified open wound of unspecified finger without damage to nail, initial encounter: Secondary | ICD-10-CM | POA: Diagnosis not present

## 2013-02-13 NOTE — ED Provider Notes (Addendum)
TIME SEEN: 8:14 AM  CHIEF COMPLAINT: Right third digit laceration  HPI: Patient is a 28 year old ambidextrous male who presents emergency department with a laceration to his radial aspect of his dorsal right third middle finger that occurred this morning at work. He reports he was working with sheet metal when he cut his finger. He applied pressure and bleeding stopped after several minutes. No other injury. Reports his last tetanus vaccination was one year ago. No numbness. He is able to move his fingers without difficulty.  ROS: See HPI Constitutional: no fever  Eyes: no drainage  ENT: no runny nose   Cardiovascular:  no chest pain  Resp: no SOB  GI: no vomiting GU: no dysuria Integumentary: no rash  Allergy: no hives  Musculoskeletal: no leg swelling  Neurological: no slurred speech ROS otherwise negative  PAST MEDICAL HISTORY/PAST SURGICAL HISTORY:  Past Medical History  Diagnosis Date  . Anxiety   . Depression     MEDICATIONS:  Prior to Admission medications   Medication Sig Start Date End Date Taking? Authorizing Provider  divalproex (DEPAKOTE) 250 MG DR tablet Take 1 tablet (250 mg total) by mouth every 12 (twelve) hours. For mood stabilization. 10/25/12   Verne Spurr, PA-C  DULoxetine (CYMBALTA) 30 MG capsule Take 1 capsule (30 mg total) by mouth daily. For depression. 10/25/12   Verne Spurr, PA-C  QUEtiapine (SEROQUEL) 100 MG tablet Take 1 tablet (100 mg total) by mouth at bedtime. For mood stabilization, depression and insomnia. 10/25/12   Verne Spurr, PA-C    ALLERGIES:  Allergies  Allergen Reactions  . Tylenol [Acetaminophen] Rash and Other (See Comments)    Upset stomach    SOCIAL HISTORY:  History  Substance Use Topics  . Smoking status: Current Every Day Smoker -- 0.50 packs/day    Types: Cigarettes  . Smokeless tobacco: Not on file  . Alcohol Use: Yes     Comment: pt reports occasional etoh use and regular suboxone and Xanax use    FAMILY  HISTORY: History reviewed. No pertinent family history.  EXAM: BP 126/73  Pulse 61  Temp(Src) 98.2 F (36.8 C) (Oral)  Resp 18  SpO2 100% CONSTITUTIONAL: Alert and oriented and responds appropriately to questions. Well-appearing; well-nourished HEAD: Normocephalic EYES: Conjunctivae clear, PERRL ENT: normal nose; no rhinorrhea; moist mucous membranes; pharynx without lesions noted NECK: Supple, no meningismus, no LAD  CARD: RRR; S1 and S2 appreciated; no murmurs, no clicks, no rubs, no gallops RESP: Normal chest excursion without splinting or tachypnea; breath sounds clear and equal bilaterally; no wheezes, no rhonchi, no rales,  ABD/GI: Normal bowel sounds; non-distended; soft, non-tender, no rebound, no guarding BACK:  The back appears normal and is non-tender to palpation, there is no CVA tenderness EXT: Full range of motion in the right fingers and wrist, superficial laceration to the right third middle finger with no tendon involvement, sensation to light touch intact diffusely, no bony injury, Normal ROM in all joints; non-tender to palpation; no edema; normal capillary refill; no cyanosis    SKIN: Normal color for age and race; warm, 3 cm superficial laceration to the radial aspect of the dorsal right third finger at the PIP NEURO: Moves all extremities equally PSYCH: The patient's mood and manner are appropriate. Grooming and personal hygiene are appropriate.  MEDICAL DECISION MAKING: Patient is superficial laceration to his right third middle finger. Have discussed with patient that he may require one to 2 stitches especially given the location at the PIP. Patient reports he  would prefer Dermabond over sutures today but is aware due to the tension in this area with flexion of his fingers the wound may reopen. Will clean wound, apply Dermabond, place and sterile dressing.  Given supportive care instructions. Given return precautions.   LACERATION REPAIR Performed by: Raelyn NumberWARD, Aneka Fagerstrom  N Authorized by: Raelyn NumberWARD, Vincent Ehrler N Consent: Verbal consent obtained. Risks and benefits: risks, benefits and alternatives were discussed Consent given by: patient Patient identity confirmed: provided demographic data Prepped and Draped in normal sterile fashion Wound explored  Laceration Location: Right third middle finger at the radial, dorsal aspect at the PIP  Laceration Length: 3 cm  No Foreign Bodies seen or palpated  Anesthesia: local infiltration  Local anesthetic: None   Anesthetic total: 0 ml  Irrigation method: syringe Amount of cleaning: standard  Skin closure: Dermabond   Number of sutures: 0   Technique: Wound clean, Dermabond applied, sterile dressing applied   Patient tolerance: Patient tolerated the procedure well with no immediate complications.     Layla MawKristen N Curley Fayette, DO 02/13/13 [id number]  Layla MawKristen N Dianey Suchy, DO 02/13/13 [id number]

## 2013-02-13 NOTE — ED Notes (Signed)
Pt amb to room 5 with quick steady gait in nad. Pt reports cutting his right third digit on sheet metal at work this morning.

## 2013-02-13 NOTE — Discharge Instructions (Signed)

## 2013-03-23 ENCOUNTER — Encounter (HOSPITAL_COMMUNITY): Payer: Self-pay | Admitting: Emergency Medicine

## 2013-03-23 ENCOUNTER — Emergency Department (INDEPENDENT_AMBULATORY_CARE_PROVIDER_SITE_OTHER)
Admission: EM | Admit: 2013-03-23 | Discharge: 2013-03-23 | Disposition: A | Discharge status: Home / Self Care | Payer: Self-pay | Source: Home / Self Care | Attending: Family Medicine | Admitting: Family Medicine

## 2013-03-23 DIAGNOSIS — G5600 Carpal tunnel syndrome, unspecified upper limb: Secondary | ICD-10-CM

## 2013-03-23 DIAGNOSIS — G5603 Carpal tunnel syndrome, bilateral upper limbs: Secondary | ICD-10-CM

## 2013-03-23 MED ORDER — DICLOFENAC POTASSIUM 50 MG PO TABS: 50.0000 mg | ORAL_TABLET | Freq: Three times a day (TID) | ORAL | Status: DC

## 2013-03-23 NOTE — ED Notes (Addendum)
C/O BILATERAL WRIST PAIN AND SWELLING  STATES THE PAIN IS WORST AT NIGHT HAS BEEN WEARING WRIST SPLINTS BUT NO RELIEF. PATIENT DOES HAVE OLD SELF INFLICTED WOUNDS

## 2013-03-23 NOTE — ED Provider Notes (Signed)
CSN: [id number]     Arrival date & time 03/23/13  1456 History   First MD Initiated Contact with Patient 03/23/13 1527     Chief Complaint  Patient presents with  . Wrist Pain   (Consider location/radiation/quality/duration/timing/severity/associated sxs/prior Treatment) Patient is a 28 y.o. male presenting with wrist pain. The history is provided by the patient.  Wrist Pain This is a new problem. The current episode started more than 1 week ago (2mos of sx). The problem has been gradually worsening.    Past Medical History  Diagnosis Date  . Anxiety   . Depression    History reviewed. No pertinent past surgical history. History reviewed. No pertinent family history. History  Substance Use Topics  . Smoking status: Current Every Day Smoker -- 0.50 packs/day    Types: Cigarettes  . Smokeless tobacco: Not on file  . Alcohol Use: Yes     Comment: pt reports occasional etoh use and regular suboxone and Xanax use    Review of Systems  Constitutional: Negative.   Musculoskeletal: Positive for joint swelling.  Skin: Negative.   Neurological: Positive for numbness.    Allergies  Tylenol  Home Medications   Current Outpatient Rx  Name  Route  Sig  Dispense  Refill  . divalproex (DEPAKOTE) 250 MG DR tablet   Oral   Take 1 tablet (250 mg total) by mouth every 12 (twelve) hours. For mood stabilization.   60 tablet   0   . DULoxetine (CYMBALTA) 30 MG capsule   Oral   Take 1 capsule (30 mg total) by mouth daily. For depression.   30 capsule   0   . QUEtiapine (SEROQUEL) 100 MG tablet   Oral   Take 1 tablet (100 mg total) by mouth at bedtime. For mood stabilization, depression and insomnia.   30 tablet   0    BP 120/75  Pulse 63  Temp(Src) 99.2 F (37.3 C) (Oral)  Resp 16  SpO2 100% Physical Exam  Nursing note and vitals reviewed. Constitutional: He is oriented to person, place, and time. He appears well-developed and well-nourished.  Musculoskeletal: He  exhibits tenderness.       Right wrist: He exhibits tenderness and swelling. He exhibits no crepitus and no deformity.       Left wrist: He exhibits tenderness and swelling.       Arms: Neurological: He is alert and oriented to person, place, and time.  Skin: Skin is warm and dry.    ED Course  Procedures (including critical care time) Labs Review Labs Reviewed - No data to display Imaging Review No results found.   MDM   1. Carpal tunnel syndrome, bilateral        Linna HoffJames D Zayne Marovich, MD 03/23/13 1630

## 2013-05-06 ENCOUNTER — Emergency Department (HOSPITAL_BASED_OUTPATIENT_CLINIC_OR_DEPARTMENT_OTHER): Payer: Worker's Compensation

## 2013-05-06 ENCOUNTER — Emergency Department (HOSPITAL_BASED_OUTPATIENT_CLINIC_OR_DEPARTMENT_OTHER)
Admission: EM | Admit: 2013-05-06 | Discharge: 2013-05-06 | Disposition: A | Discharge status: Home / Self Care | Payer: Worker's Compensation | Attending: Emergency Medicine | Admitting: Emergency Medicine

## 2013-05-06 ENCOUNTER — Encounter (HOSPITAL_BASED_OUTPATIENT_CLINIC_OR_DEPARTMENT_OTHER): Payer: Self-pay | Admitting: Emergency Medicine

## 2013-05-06 DIAGNOSIS — X58XXXA Exposure to other specified factors, initial encounter: Secondary | ICD-10-CM | POA: Insufficient documentation

## 2013-05-06 DIAGNOSIS — Y939 Activity, unspecified: Secondary | ICD-10-CM | POA: Insufficient documentation

## 2013-05-06 DIAGNOSIS — F411 Generalized anxiety disorder: Secondary | ICD-10-CM | POA: Insufficient documentation

## 2013-05-06 DIAGNOSIS — Z79899 Other long term (current) drug therapy: Secondary | ICD-10-CM | POA: Insufficient documentation

## 2013-05-06 DIAGNOSIS — F329 Major depressive disorder, single episode, unspecified: Secondary | ICD-10-CM | POA: Insufficient documentation

## 2013-05-06 DIAGNOSIS — S335XXA Sprain of ligaments of lumbar spine, initial encounter: Secondary | ICD-10-CM | POA: Insufficient documentation

## 2013-05-06 DIAGNOSIS — Y929 Unspecified place or not applicable: Secondary | ICD-10-CM | POA: Insufficient documentation

## 2013-05-06 DIAGNOSIS — F3289 Other specified depressive episodes: Secondary | ICD-10-CM | POA: Insufficient documentation

## 2013-05-06 DIAGNOSIS — S39012A Strain of muscle, fascia and tendon of lower back, initial encounter: Secondary | ICD-10-CM

## 2013-05-06 DIAGNOSIS — Z791 Long term (current) use of non-steroidal anti-inflammatories (NSAID): Secondary | ICD-10-CM | POA: Insufficient documentation

## 2013-05-06 DIAGNOSIS — F17200 Nicotine dependence, unspecified, uncomplicated: Secondary | ICD-10-CM | POA: Insufficient documentation

## 2013-05-06 MED ORDER — METHOCARBAMOL 500 MG PO TABS: 500.0000 mg | ORAL_TABLET | Freq: Two times a day (BID) | ORAL | Status: DC

## 2013-05-06 MED ORDER — DICLOFENAC POTASSIUM 50 MG PO TABS: 50.0000 mg | ORAL_TABLET | Freq: Three times a day (TID) | ORAL | Status: DC

## 2013-05-06 NOTE — ED Provider Notes (Signed)
CSN: [id number]     Arrival date & time 05/06/13  [id number] History   First MD Initiated Contact with Patient 05/06/13 [phone]     No chief complaint on file.    (Consider location/radiation/quality/duration/timing/severity/associated sxs/prior Treatment) Patient is a 28 y.o. male presenting with back pain. The history is provided by the patient.  Back Pain Location:  Sacro-iliac joint Quality:  Aching Radiates to:  Does not radiate Pain severity:  Severe Pain is:  Same all the time Onset quality:  Sudden Duration:  1 day Timing:  Constant Progression:  Unchanged Chronicity:  New Context: not jumping from heights   Context comment:  Bending forward at job Relieved by:  Nothing Worsened by:  Nothing tried Ineffective treatments:  None tried Associated symptoms: no abdominal pain, no abdominal swelling, no bladder incontinence, no bowel incontinence, no chest pain, no dysuria, no fever, no headaches, no leg pain, no numbness, no paresthesias, no pelvic pain, no perianal numbness, no tingling, no weakness and no weight loss   Risk factors: no recent surgery     Past Medical History  Diagnosis Date  . Anxiety   . Depression    No past surgical history on file. No family history on file. History  Substance Use Topics  . Smoking status: Current Every Day Smoker -- 0.50 packs/day    Types: Cigarettes  . Smokeless tobacco: Not on file  . Alcohol Use: Yes     Comment: pt reports occasional etoh use and regular suboxone and Xanax use    Review of Systems  Constitutional: Negative for fever and weight loss.  Cardiovascular: Negative for chest pain.  Gastrointestinal: Negative for abdominal pain and bowel incontinence.  Genitourinary: Negative for bladder incontinence, dysuria and pelvic pain.  Musculoskeletal: Positive for back pain.  Neurological: Negative for tingling, weakness, numbness, headaches and paresthesias.  All other systems reviewed and are negative.     Allergies   Tylenol  Home Medications   Prior to Admission medications   Medication Sig Start Date End Date Taking? Authorizing Provider  diclofenac (CATAFLAM) 50 MG tablet Take 1 tablet (50 mg total) by mouth 3 (three) times daily. 03/23/13   Linna HoffJames D Kindl, MD  divalproex (DEPAKOTE) 250 MG DR tablet Take 1 tablet (250 mg total) by mouth every 12 (twelve) hours. For mood stabilization. 10/25/12   Verne SpurrNeil Mashburn, PA-C  DULoxetine (CYMBALTA) 30 MG capsule Take 1 capsule (30 mg total) by mouth daily. For depression. 10/25/12   Verne SpurrNeil Mashburn, PA-C  QUEtiapine (SEROQUEL) 100 MG tablet Take 1 tablet (100 mg total) by mouth at bedtime. For mood stabilization, depression and insomnia. 10/25/12   Verne SpurrNeil Mashburn, PA-C   BP 126/77  Pulse 62  Temp(Src) 98.1 F (36.7 C) (Oral)  Resp 18  Ht 5\' 7"  (1.702 m)  Wt 200 lb (90.719 kg)  BMI 31.32 kg/m2  SpO2 99% Physical Exam  Constitutional: He is oriented to person, place, and time. He appears well-developed and well-nourished. No distress.  HENT:  Head: Normocephalic and atraumatic.  Mouth/Throat: Oropharynx is clear and moist.  Eyes: Conjunctivae are normal. Pupils are equal, round, and reactive to light.  Neck: Normal range of motion. Neck supple.  Cardiovascular: Normal rate, regular rhythm and intact distal pulses.   Pulmonary/Chest: Effort normal and breath sounds normal. He has no wheezes. He has no rales.  Abdominal: Soft. Bowel sounds are normal. There is no tenderness. There is no rebound and no guarding.  Musculoskeletal: Normal range of motion.  Gait normal  Neurological: He is alert and oriented to person, place, and time. He has normal reflexes. He displays normal reflexes.  Skin: Skin is warm and dry.  Psychiatric: He has a normal mood and affect.    ED Course  Procedures (including critical care time) Labs Review Labs Reviewed - No data to display  Imaging Review No results found.   EKG Interpretation None      MDM   Final  diagnoses:  None    Symptoms consistent with muscle strain will prescribe NSAIDs and muscle relaxants     Ronav Furney K Zerah Hilyer-Rasch, MD 05/06/13 [phone]

## 2013-05-06 NOTE — ED Notes (Signed)
Report received from Clare CharonKatie Cranston, RN and care assumed.  Workplace drug screen performed.

## 2013-05-06 NOTE — Discharge Instructions (Signed)
Back Exercises These exercises may help you when beginning to rehabilitate your injury. Your symptoms may resolve with or without further involvement from your physician, physical therapist or athletic trainer. While completing these exercises, remember:   Restoring tissue flexibility helps normal motion to return to the joints. This allows healthier, less painful movement and activity.  An effective stretch should be held for at least 30 seconds.  A stretch should never be painful. You should only feel a gentle lengthening or release in the stretched tissue. STRETCH  Extension, Prone on Elbows   Lie on your stomach on the floor, a bed will be too soft. Place your palms about shoulder width apart and at the height of your head.  Place your elbows under your shoulders. If this is too painful, stack pillows under your chest.  Allow your body to relax so that your hips drop lower and make contact more completely with the floor.  Hold this position for __________ seconds.  Slowly return to lying flat on the floor. Repeat __________ times. Complete this exercise __________ times per day.  RANGE OF MOTION  Extension, Prone Press Ups   Lie on your stomach on the floor, a bed will be too soft. Place your palms about shoulder width apart and at the height of your head.  Keeping your back as relaxed as possible, slowly straighten your elbows while keeping your hips on the floor. You may adjust the placement of your hands to maximize your comfort. As you gain motion, your hands will come more underneath your shoulders.  Hold this position __________ seconds.  Slowly return to lying flat on the floor. Repeat __________ times. Complete this exercise __________ times per day.  RANGE OF MOTION- Quadruped, Neutral Spine   Assume a hands and knees position on a firm surface. Keep your hands under your shoulders and your knees under your hips. You may place padding under your knees for comfort.  Drop  your head and point your tail bone toward the ground below you. This will round out your low back like an angry cat. Hold this position for __________ seconds.  Slowly lift your head and release your tail bone so that your back sags into a large arch, like an old horse.  Hold this position for __________ seconds.  Repeat this until you feel limber in your low back.  Now, find your "sweet spot." This will be the most comfortable position somewhere between the two previous positions. This is your neutral spine. Once you have found this position, tense your stomach muscles to support your low back.  Hold this position for __________ seconds. Repeat __________ times. Complete this exercise __________ times per day.  STRETCH  Flexion, Single Knee to Chest   Lie on a firm bed or floor with both legs extended in front of you.  Keeping one leg in contact with the floor, bring your opposite knee to your chest. Hold your leg in place by either grabbing behind your thigh or at your knee.  Pull until you feel a gentle stretch in your low back. Hold __________ seconds.  Slowly release your grasp and repeat the exercise with the opposite side. Repeat __________ times. Complete this exercise __________ times per day.  STRETCH - Hamstrings, Standing  Stand or sit and extend your right / left leg, placing your foot on a chair or foot stool  Keeping a slight arch in your low back and your hips straight forward.  Lead with your chest and  lean forward at the waist until you feel a gentle stretch in the back of your right / left knee or thigh. (When done correctly, this exercise requires leaning only a small distance.)  Hold this position for __________ seconds. Repeat __________ times. Complete this stretch __________ times per day. STRENGTHENING  Deep Abdominals, Pelvic Tilt   Lie on a firm bed or floor. Keeping your legs in front of you, bend your knees so they are both pointed toward the ceiling and  your feet are flat on the floor.  Tense your lower abdominal muscles to press your low back into the floor. This motion will rotate your pelvis so that your tail bone is scooping upwards rather than pointing at your feet or into the floor.  With a gentle tension and even breathing, hold this position for __________ seconds. Repeat __________ times. Complete this exercise __________ times per day.  STRENGTHENING  Abdominals, Crunches   Lie on a firm bed or floor. Keeping your legs in front of you, bend your knees so they are both pointed toward the ceiling and your feet are flat on the floor. Cross your arms over your chest.  Slightly tip your chin down without bending your neck.  Tense your abdominals and slowly lift your trunk high enough to just clear your shoulder blades. Lifting higher can put excessive stress on the low back and does not further strengthen your abdominal muscles.  Control your return to the starting position. Repeat __________ times. Complete this exercise __________ times per day.  STRENGTHENING  Quadruped, Opposite UE/LE Lift   Assume a hands and knees position on a firm surface. Keep your hands under your shoulders and your knees under your hips. You may place padding under your knees for comfort.  Find your neutral spine and gently tense your abdominal muscles so that you can maintain this position. Your shoulders and hips should form a rectangle that is parallel with the floor and is not twisted.  Keeping your trunk steady, lift your right hand no higher than your shoulder and then your left leg no higher than your hip. Make sure you are not holding your breath. Hold this position __________ seconds.  Continuing to keep your abdominal muscles tense and your back steady, slowly return to your starting position. Repeat with the opposite arm and leg. Repeat __________ times. Complete this exercise __________ times per day. Document Released: 01/13/2005 Document  Revised: 03/20/2011 Document Reviewed: 04/09/2008 [facility] Patient Information 2014 [place], [place].  Back Pain, Adult Low back pain is very common. About 1 in 5 people have back pain.The cause of low back pain is rarely dangerous. The pain often gets better over time.About half of people with a sudden onset of back pain feel better in just 2 weeks. About 8 in 10 people feel better by 6 weeks.  CAUSES Some common causes of back pain include:  Strain of the muscles or ligaments supporting the spine.  Wear and tear (degeneration) of the spinal discs.  Arthritis.  Direct injury to the back. DIAGNOSIS Most of the time, the direct cause of low back pain is not known.However, back pain can be treated effectively even when the exact cause of the pain is unknown.Answering your caregiver's questions about your overall health and symptoms is one of the most accurate ways to make sure the cause of your pain is not dangerous. If your caregiver needs more information, he or she may order lab work or imaging tests (X-rays or MRIs).However, even  if imaging tests show changes in your back, this usually does not require surgery. HOME CARE INSTRUCTIONS For many people, back pain returns.Since low back pain is rarely dangerous, it is often a condition that people can learn to [facility] their own.   Remain active. It is stressful on the back to sit or stand in one place. Do not sit, drive, or stand in one place for more than 30 minutes at a time. Take short walks on level surfaces as soon as pain allows.Try to increase the length of time you walk each day.  Do not stay in bed.Resting more than 1 or 2 days can delay your recovery.  Do not avoid exercise or work.Your body is made to move.It is not dangerous to be active, even though your back may hurt.Your back will likely heal faster if you return to being active before your pain is gone.  Pay attention to your body when you bend and lift. Many  people have less discomfortwhen lifting if they bend their knees, keep the load close to their bodies,and avoid twisting. Often, the most comfortable positions are those that put less stress on your recovering back.  Find a comfortable position to sleep. Use a firm mattress and lie on your side with your knees slightly bent. If you lie on your back, put a pillow under your knees.  Only take over-the-counter or prescription medicines as directed by your caregiver. Over-the-counter medicines to reduce pain and inflammation are often the most helpful.Your caregiver may prescribe muscle relaxant drugs.These medicines help dull your pain so you can more quickly return to your normal activities and healthy exercise.  Put ice on the injured area.  Put ice in a plastic bag.  Place a towel between your skin and the bag.  Leave the ice on for 15-20 minutes, 03-04 times a day for the first 2 to 3 days. After that, ice and heat may be alternated to reduce pain and spasms.  Ask your caregiver about trying back exercises and gentle massage. This may be of some benefit.  Avoid feeling anxious or stressed.Stress increases muscle tension and can worsen back pain.It is important to recognize when you are anxious or stressed and learn ways to manage it.Exercise is a great option. SEEK MEDICAL CARE IF:  You have pain that is not relieved with rest or medicine.  You have pain that does not improve in 1 week.  You have new symptoms.  You are generally not feeling well. SEEK IMMEDIATE MEDICAL CARE IF:   You have pain that radiates from your back into your legs.  You develop new bowel or bladder control problems.  You have unusual weakness or numbness in your arms or legs.  You develop nausea or vomiting.  You develop abdominal pain.  You feel faint. Document Released: 12/26/2004 Document Revised: 06/27/2011 Document Reviewed: 05/16/2010 [facility] Patient Information 2014 [place], [place].

## 2013-05-06 NOTE — ED Notes (Signed)
Pt reports that he leaned down yesterday at work and felt a pop in his lower (R) back.  Reports pain now when twisting and turning.

## 2014-10-10 IMAGING — CR DG LUMBAR SPINE COMPLETE 4+V
5 series · 5 of 5 positions shown · non-contrast
Comparison: None.

CLINICAL DATA: Lower back pain.  Injury to lower back.

EXAM:
LUMBAR SPINE - COMPLETE 4+ VIEW

[t l-spine a.p.]
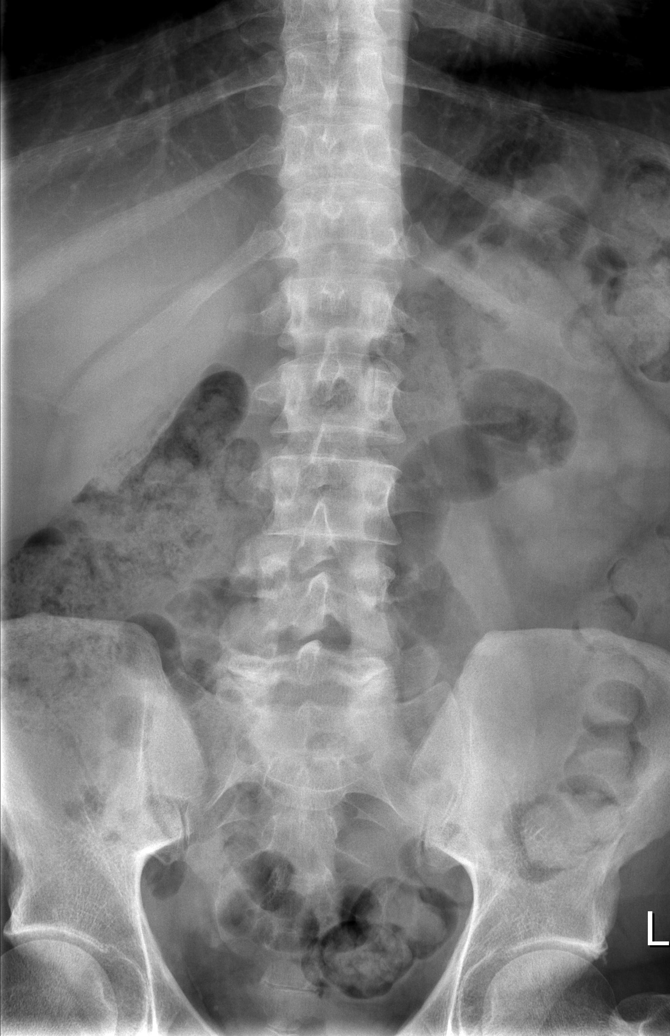

[t l-spine oblique exposure (1 of 2)]
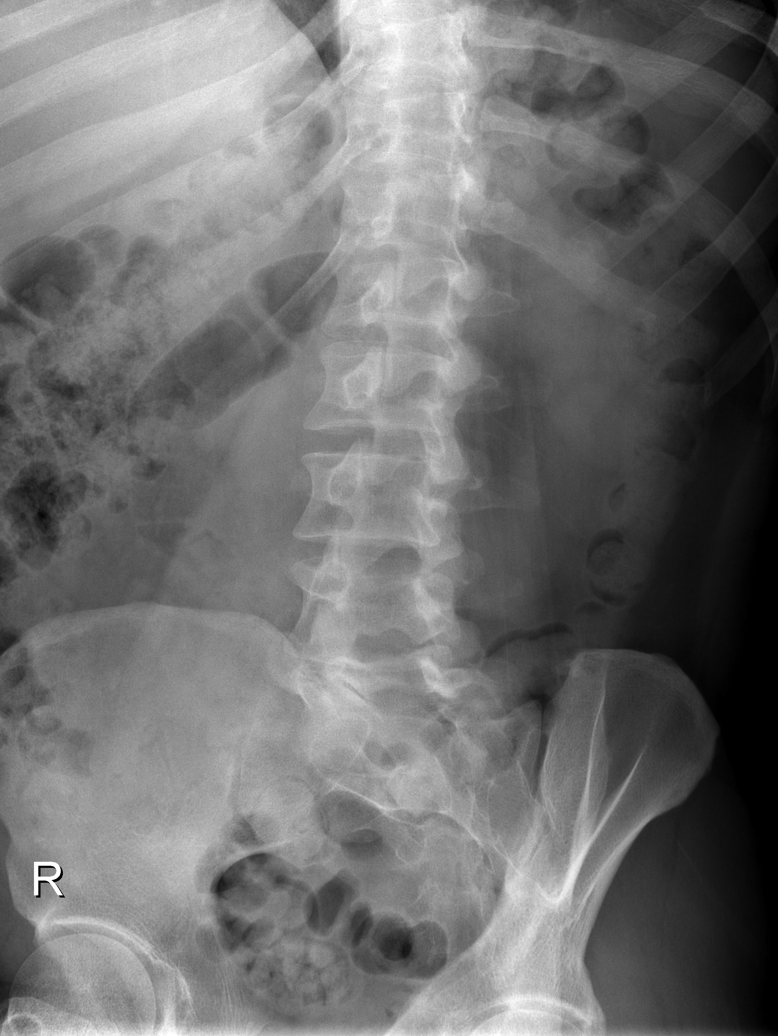

[t l-spine oblique exposure (2 of 2)]
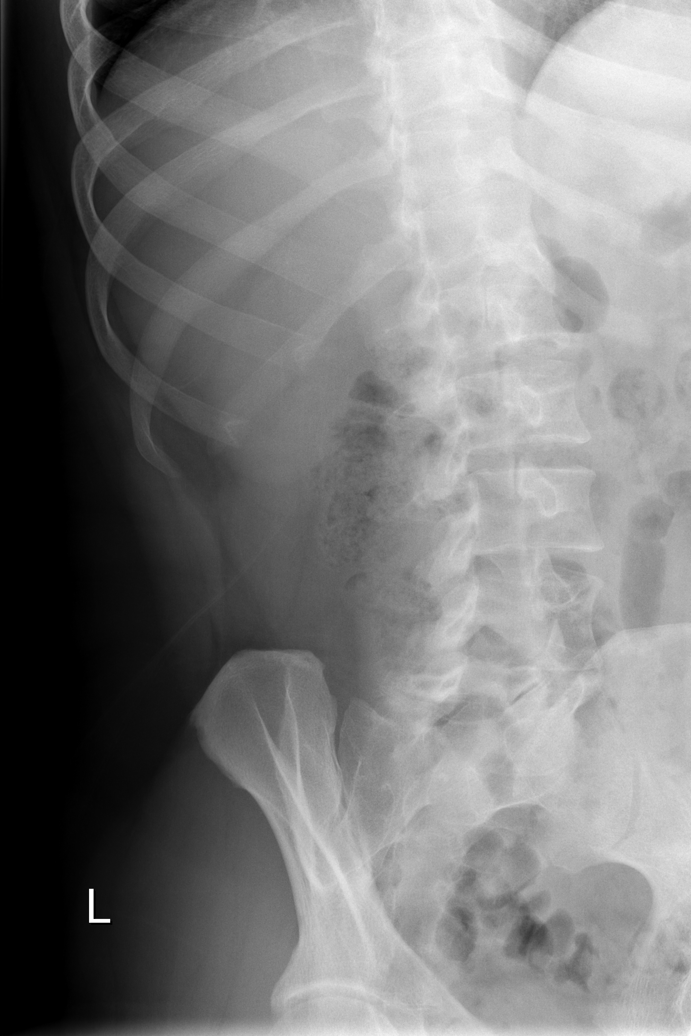

[t l-spine lat]
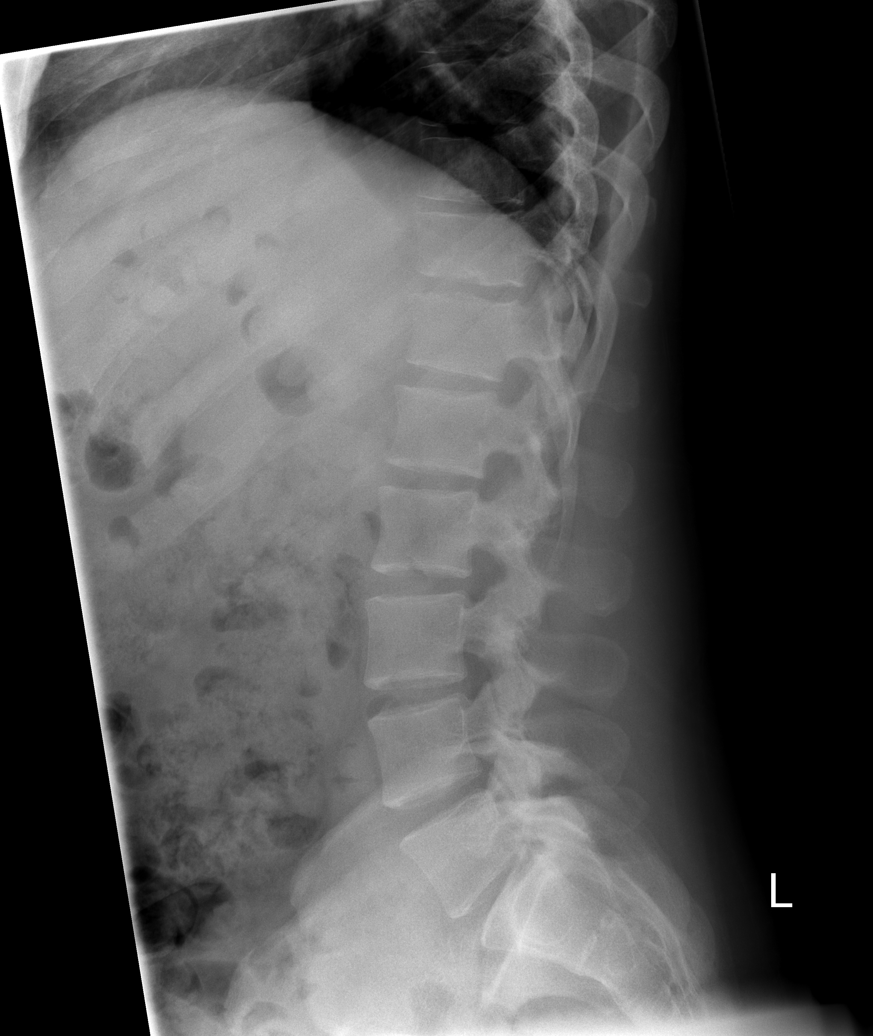

[t l-spine l5-s1 spot]
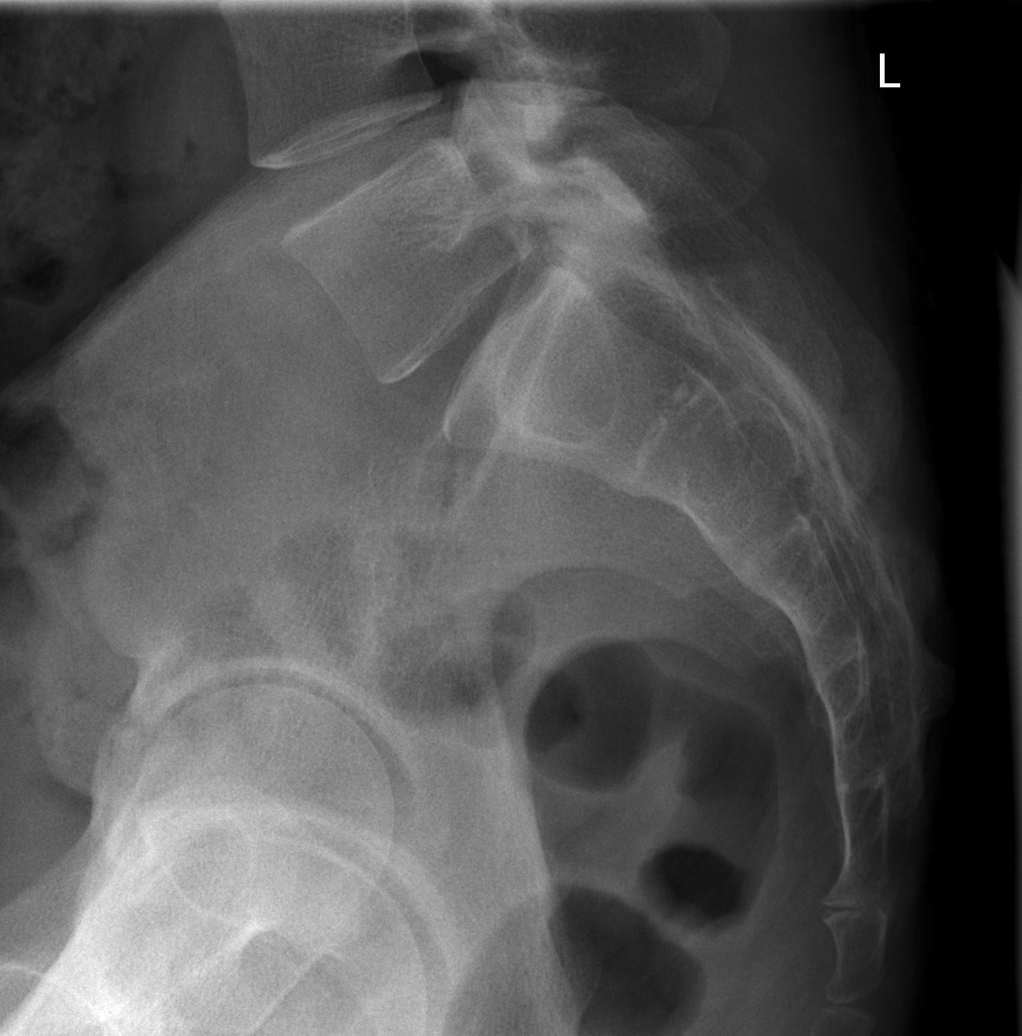

[5 of 5 positions shown; findings below may reference images not displayed]

FINDINGS: There is no evidence of fracture or subluxation. Vertebral bodies
demonstrate normal height and alignment. Intervertebral disc spaces
are preserved. The visualized neural foramina are grossly
unremarkable in appearance.

The visualized bowel gas pattern is unremarkable in appearance; air
and stool are noted within the colon. The sacroiliac joints are
within normal limits.
IMPRESSION: No evidence fracture or subluxation along the lumbar spine.

## 2015-10-14 ENCOUNTER — Emergency Department (INDEPENDENT_AMBULATORY_CARE_PROVIDER_SITE_OTHER)
Admission: EM | Admit: 2015-10-14 | Discharge: 2015-10-14 | Disposition: A | Discharge status: Home / Self Care | Payer: Managed Care, Other (non HMO) | Source: Home / Self Care | Attending: Family Medicine | Admitting: Family Medicine

## 2015-10-14 ENCOUNTER — Encounter: Payer: Self-pay | Admitting: Emergency Medicine

## 2015-10-14 ENCOUNTER — Emergency Department (INDEPENDENT_AMBULATORY_CARE_PROVIDER_SITE_OTHER): Payer: Managed Care, Other (non HMO)

## 2015-10-14 DIAGNOSIS — J069 Acute upper respiratory infection, unspecified: Secondary | ICD-10-CM | POA: Diagnosis not present

## 2015-10-14 DIAGNOSIS — R0789 Other chest pain: Secondary | ICD-10-CM | POA: Diagnosis not present

## 2015-10-14 NOTE — ED Triage Notes (Signed)
Patient states that every morning for the past 2 years when he takes the first deep breath of the day he has a gurgling sound deep in his lungs and never any other time.  He had pneumonia just prior to that and did not finish the full course of ABT and thinks he still has pneumonia, denies cough, chills, night sweats, but feels that something is coming on. Took Tylenol today.

## 2015-10-14 NOTE — ED Provider Notes (Signed)
[facility]    CSN: [id number] Arrival date & time: 10/14/15  1438     History   Chief Complaint Chief Complaint  Patient presents with  . Nasal Congestion    HPI Lee Hernandez is a 30 y.o. male.   Patient states that every morning for the past two years, he awakens with a sensation of a "gurgling" sound deep in his lungs briefly.  He states that just prior to that he had pneumonia, but did not finish all of his antibiotic because he felt well.  He has no shortness of breath or wheezing at other times, although his symptoms are more likely to occur after he "vapes.."  He states that he has been fatigued during the past two days, and has had chills. He had asthma as a child.   The history is provided by the patient.    Past Medical History:  Diagnosis Date  . Anxiety   . Depression     Patient Active Problem List   Diagnosis Date Noted  . Bipolar I disorder with depression, severe (HCC) 10/22/2012  . Polysubstance dependence including opioid type drug, episodic abuse (HCC) 10/22/2012  . Suicidal ideations 10/22/2012    History reviewed. No pertinent surgical history.     Home Medications    Prior to Admission medications   Medication Sig Start Date End Date Taking? Authorizing Provider  buprenorphine-naloxone (SUBOXONE) 2-0.5 MG SUBL SL tablet Place 1 tablet under the tongue daily.   Yes Historical Provider, MD    Family History No family history on file.  Social History Social History  Substance Use Topics  . Smoking status: Current Every Day Smoker    Packs/day: 0.50    Types: Cigarettes, E-cigarettes  . Smokeless tobacco: Current User  . Alcohol use Yes     Comment: pt reports occasional etoh use and regular suboxone and Xanax use     Allergies   Tylenol [acetaminophen]   Review of Systems Review of Systems No sore throat No cough No pleuritic pain ? wheezing No nasal congestion No post-nasal drainage No sinus  pain/pressure No itchy/red eyes No earache No hemoptysis No SOB No fever, + chills No nausea No vomiting No abdominal pain No diarrhea No urinary symptoms No skin rash No fatigue No myalgias No headache Used OTC meds without relief   Physical Exam Triage Vital Signs ED Triage Vitals  Enc Vitals Group     BP 10/14/15 1532 124/80     Pulse Rate 10/14/15 1532 69     Resp --      Temp 10/14/15 1532 98.5 F (36.9 C)     Temp Source 10/14/15 1532 Oral     SpO2 10/14/15 1532 99 %     Weight 10/14/15 1533 207 lb (93.9 kg)     Height 10/14/15 1533 5\' 8"  (1.727 m)     Head Circumference --      Peak Flow --      Pain Score 10/14/15 1535 0     Pain Loc --      Pain Edu? --      Excl. in GC? --    No data found.   Updated Vital Signs BP 124/80 (BP Location: Left Arm)   Pulse 69   Temp 98.5 F (36.9 C) (Oral)   Ht 5\' 8"  (1.727 m)   Wt 207 lb (93.9 kg)   SpO2 99%   BMI 31.47 kg/m   Visual Acuity Right Eye Distance:  Left Eye Distance:   Bilateral Distance:    Right Eye Near:   Left Eye Near:    Bilateral Near:     Physical Exam Nursing notes and Vital Signs reviewed. Appearance:  Patient appears stated age, and in no acute distress Eyes:  Pupils are equal, round, and reactive to light and accomodation.  Extraocular movement is intact.  Conjunctivae are not inflamed  Ears:  Canals normal.  Tympanic membranes normal.  Nose:  Mildly congested turbinates.  No sinus tenderness.   Pharynx:  Normal Neck:  Supple.  Tender enlarged posterior/lateral nodes are palpated bilaterally  Lungs:  Clear to auscultation.  Breath sounds are equal.  Moving air well. Heart:  Regular rate and rhythm without murmurs, rubs, or gallops.  Abdomen:  Nontender without masses or hepatosplenomegaly.  Bowel sounds are present.  No CVA or flank tenderness.  Extremities:  No edema.  Skin:  No rash present.    UC Treatments / Results  Labs (all labs ordered are listed, but only abnormal  results are displayed) Labs Reviewed - No data to display  EKG  EKG Interpretation None       Radiology Dg Chest 2 View  Result Date: 10/14/2015 CLINICAL DATA:  Sensation of abnormal fluid in the lungs every morning. EXAM: CHEST  2 VIEW COMPARISON:  None. FINDINGS: Heart size is normal. Mediastinal shadows are normal. The lungs are clear. No bronchial thickening. No infiltrate, mass, effusion or collapse. Pulmonary vascularity is normal. No bony abnormality. IMPRESSION: Normal chest Electronically Signed   By: Paulina FusiMark  Shogry M.D.   On: 10/14/2015 16:10    Procedures Procedures (including critical care time)  Medications Ordered in UC Medications - No data to display   Initial Impression / Assessment and Plan / UC Course  I have reviewed the triage vital signs and the nursing notes.  Pertinent labs & imaging results that were available during my care of the patient were reviewed by me and considered in my medical decision making (see chart for details).  Clinical Course  ?nocturnal wheeze. Suspect early viral URI. If cold like symptoms develop, try the following: Take plain guaifenesin (1200mg  extended release tabs such as Mucinex) twice daily, with plenty of water, for cough and congestion.  May add Pseudoephedrine (30mg , one or two every 4 to 6 hours) for sinus congestion.  Get adequate rest.   May use Afrin nasal spray (or generic oxymetazoline) twice daily for about 5 days and then discontinue.  Also recommend using saline nasal spray several times daily and saline nasal irrigation (AYR is a common brand).  Use Flonase nasal spray each morning after using Afrin nasal spray and saline nasal irrigation. Try warm salt water gargles for sore throat.  Stop all antihistamines for now, and other non-prescription cough/cold preparations. May take Ibuprofen 200mg , 4 tabs every 8 hours with food for chest/sternum discomfort, sore throat, etc. May take Delsym Cough Suppressant at bedtime  for nighttime cough.  Follow-up with family doctor if not improving about10 days.      Final Clinical Impressions(s) / UC Diagnoses   Final diagnoses:  URI, acute    New Prescriptions New Prescriptions   No medications on file     Lattie HawStephen A Beese, MD 10/17/15 [id number]

## 2015-10-14 NOTE — Discharge Instructions (Signed)
If cold like symptoms develop, try the following: Take plain guaifenesin (1200mg  extended release tabs such as Mucinex) twice daily, with plenty of water, for cough and congestion.  May add Pseudoephedrine (30mg , one or two every 4 to 6 hours) for sinus congestion.  Get adequate rest.   May use Afrin nasal spray (or generic oxymetazoline) twice daily for about 5 days and then discontinue.  Also recommend using saline nasal spray several times daily and saline nasal irrigation (AYR is a common brand).  Use Flonase nasal spray each morning after using Afrin nasal spray and saline nasal irrigation. Try warm salt water gargles for sore throat.  Stop all antihistamines for now, and other non-prescription cough/cold preparations. May take Ibuprofen 200mg , 4 tabs every 8 hours with food for chest/sternum discomfort, sore throat, etc. May take Delsym Cough Suppressant at bedtime for nighttime cough.  Follow-up with family doctor if not improving about10 days.

## 2023-01-31 ENCOUNTER — Ambulatory Visit (INDEPENDENT_AMBULATORY_CARE_PROVIDER_SITE_OTHER): Payer: Medicaid Other | Admitting: Urology

## 2023-01-31 ENCOUNTER — Encounter: Payer: Self-pay | Admitting: Urology

## 2023-01-31 VITALS — BP 144/81 | HR 61 | Ht 68.0 in | Wt 193.0 lb

## 2023-01-31 DIAGNOSIS — R399 Unspecified symptoms and signs involving the genitourinary system: Secondary | ICD-10-CM

## 2023-01-31 DIAGNOSIS — R3912 Poor urinary stream: Secondary | ICD-10-CM | POA: Diagnosis not present

## 2023-01-31 LAB — URINALYSIS, COMPLETE
Bilirubin, UA: NEGATIVE
Glucose, UA: NEGATIVE
Ketones, UA: NEGATIVE
Leukocytes,UA: NEGATIVE
Nitrite, UA: NEGATIVE
Protein,UA: NEGATIVE
RBC, UA: NEGATIVE
Specific Gravity, UA: 1.02 (ref 1.005–1.030)
Urobilinogen, Ur: 0.2 mg/dL (ref 0.2–1.0)
pH, UA: 7 (ref 5.0–7.5)

## 2023-01-31 LAB — MICROSCOPIC EXAMINATION: Bacteria, UA: NONE SEEN

## 2023-01-31 LAB — BLADDER SCAN AMB NON-IMAGING: Scan Result: 0

## 2023-01-31 MED ORDER — TAMSULOSIN HCL 0.4 MG PO CAPS: 0.4000 mg | ORAL_CAPSULE | Freq: Every day | ORAL | 1 refills | Status: AC

## 2023-01-31 NOTE — Progress Notes (Signed)
I, Maysun Anabel Bene, acting as a scribe for Riki Altes, MD., have documented all relevant documentation on the behalf of Riki Altes, MD, as directed by Riki Altes, MD while in the presence of Riki Altes, MD.  01/31/2023 2:45 PM   Lee Hernandez 1985/06/12 [id number]  Referring provider: Emily Filbert, MD [phone] [address], [address] [place],  [place] [address]  Chief Complaint  Patient presents with   Other    HPI: Lee Hernandez is a 38 y.o. male referred for lower urinary tract symptoms, possible prostatitis.   Around October 2023, he began to have intermittent symptoms of urinary frequency. He changed his diet and his symptoms improved. However, 4-5 months ago, his symptoms returned and only occur in the evening. He typically has nocturia x2-3. He voids small volumes and does note some decreased force and caliber of his urinary stream. He notes an occasional split stream. He will occasionally note a burning sensation at the at the end of urination which usually will resolve after 5 minutes  He had a urinalysis which was negative. He recently saw Dr. Mayford Knife and was complaining of mild ED and placed on tadalafil which he has been taking for 5 days. He has seen some improvement in his erections.   PMH: Past Medical History:  Diagnosis Date   Anxiety    Depression     Surgical History: No past surgical history on file.  Home Medications:  Allergies as of 01/31/2023       Reactions   Tylenol [acetaminophen] Rash, Other (See Comments)   Upset stomach        Medication List        Accurate as of January 31, 2023  2:45 PM. If you have any questions, ask your nurse or doctor.          buprenorphine-naloxone 2-0.5 mg Subl SL tablet Commonly known as: SUBOXONE Place 1 tablet under the tongue daily.   tadalafil 5 MG tablet Commonly known as: CIALIS Take 5 mg by mouth daily.   tamsulosin 0.4 MG Caps capsule Commonly known as: FLOMAX Take  1 capsule (0.4 mg total) by mouth daily.        Allergies:  Allergies  Allergen Reactions   Tylenol [Acetaminophen] Rash and Other (See Comments)    Upset stomach    Social History:  reports that he has been smoking cigarettes and e-cigarettes. He uses smokeless tobacco. He reports current alcohol use. He reports current drug use. Drugs: Opium and Benzodiazepines.   Physical Exam: BP (!) 144/81   Pulse 61   Ht 5\' 8"  (1.727 m)   Wt 193 lb (87.5 kg)   BMI 29.35 kg/m   Constitutional:  Alert and oriented, No acute distress. HEENT: [place] AT Respiratory: Normal respiratory effort, no increased work of breathing. GU: Prostate 30 grams, smooth without nodules or significant tenderness. No significant pelvic floor tenderness.  Psychiatric: Normal mood and affect.  Assessment & Plan:    1. Lower urinary tract symptoms UA ordered and will contact with results.  Symptoms most likely secondary to inflammatory prostatitis and if UA does not show pyuria, would recommend an initial alpha blocker trial. Rx tamsulosin 0.4 mg sent to pharmacy.  Follow up 1 month for symptom recheck.  If no improvement in his symptoms on tamsulosin, we discuss possibility of urethral stricture and cystoscopy.   I have reviewed the above documentation for accuracy and completeness, and I agree with the  above.   Riki Altes, MD  [facility] [address] [place], [place] [address] [phone]

## 2023-03-15 ENCOUNTER — Ambulatory Visit: Payer: Self-pay | Admitting: Urology
